# Patient Record
Sex: Female | Born: 2000 | Race: Black or African American | Hispanic: No | Marital: Single | Smoking: Never smoker
Health system: Southern US, Community
[De-identification: ages and names within clinical notes are randomized; demographics above are authoritative.]

## PROBLEM LIST (undated history)

## (undated) HISTORY — PX: TONSILLECTOMY: SUR1361

---
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
## 2021-01-11 ENCOUNTER — Ambulatory Visit (HOSPITAL_COMMUNITY)
Admission: EM | Admit: 2021-01-11 | Discharge: 2021-01-11 | Disposition: A | Discharge status: Home / Self Care | Payer: BC Managed Care – PPO | Attending: Medical Oncology | Admitting: Medical Oncology

## 2021-01-11 ENCOUNTER — Encounter (HOSPITAL_COMMUNITY): Payer: Self-pay

## 2021-01-11 ENCOUNTER — Other Ambulatory Visit: Payer: Self-pay

## 2021-01-11 DIAGNOSIS — N3001 Acute cystitis with hematuria: Secondary | ICD-10-CM

## 2021-01-11 LAB — POC URINE PREG, ED: Preg Test, Ur: NEGATIVE

## 2021-01-11 LAB — POCT URINALYSIS DIPSTICK, ED / UC
Glucose, UA: NEGATIVE mg/dL
Ketones, ur: NEGATIVE mg/dL
Nitrite: NEGATIVE
Protein, ur: >=300 mg/dL — AB
Specific Gravity, Urine: >=1.03 (ref 1.005–1.030)
Urobilinogen, UA: 1 mg/dL (ref 0.0–1.0)
pH: 6.5 (ref 5.0–8.0)

## 2021-01-11 MED ORDER — SULFAMETHOXAZOLE-TRIMETHOPRIM 800-160 MG PO TABS: 1.0000 | ORAL_TABLET | Freq: Two times a day (BID) | ORAL | 0 refills | Status: AC

## 2021-01-11 NOTE — ED Provider Notes (Signed)
[facility]    CSN: [id number] Arrival date & time: 01/11/21  1714      History   Chief Complaint Chief Complaint  Patient presents with  . Urinary Frequency    HPI Heidi Roman is a 20 y.o. female.   HPI   Dysuria: Pt states that she has had urinary frequency, dysuria since yesterday. Associated bladder pressure. She denies abdominal pain, vaginal discharge, fever, vomiting. She has taken one AZO for symptoms yesterday but has not taken any since. This did not help her symptoms.   History reviewed. No pertinent past medical history.  There are no problems to display for this patient.   Past Surgical History:  Procedure Laterality Date  . TONSILLECTOMY      OB History   No obstetric history on file.      Home Medications    Prior to Admission medications   Medication Sig Start Date End Date Taking? Authorizing Provider  acetaminophen (TYLENOL) 325 MG tablet Take 2 tablets (650 mg total) by mouth every 6 (six) hours as needed for mild pain, moderate pain or headache. 11/21/17   Scoville, Nadara Mustard, NP  benzonatate (TESSALON) 100 MG capsule Take 1 capsule (100 mg total) by mouth every 8 (eight) hours as needed for cough. 11/21/17   Sherrilee Gilles, NP  ibuprofen (ADVIL,MOTRIN) 600 MG tablet Take 1 tablet (600 mg total) by mouth every 6 (six) hours as needed for fever, mild pain or moderate pain. 11/21/17   Sherrilee Gilles, NP  meloxicam (MOBIC) 7.5 MG tablet Take 1 tablet (7.5 mg total) by mouth daily. 02/21/14   Carmelina Dane, MD    Family History Family History  Problem Relation Age of Onset  . Heart disease Maternal Grandmother     Social History Social History   Tobacco Use  . Smoking status: Never Smoker     Allergies   Patient has no known allergies.   Review of Systems Review of Systems  As stated above in HPI Physical Exam Triage Vital Signs ED Triage Vitals  Enc Vitals Group     BP 01/11/21 1738 121/67      Pulse Rate 01/11/21 1738 92     Resp 01/11/21 1738 16     Temp 01/11/21 1738 98.9 F (37.2 C)     Temp Source 01/11/21 1738 Oral     SpO2 01/11/21 1738 100 %     Weight --      Height --      Head Circumference --      Peak Flow --      Pain Score 01/11/21 1739 0     Pain Loc --      Pain Edu? --      Excl. in GC? --    No data found.  Updated Vital Signs BP 121/67 (BP Location: Right Arm)   Pulse 92   Temp 98.9 F (37.2 C) (Oral)   Resp 16   LMP 01/07/2021   SpO2 100%   Physical Exam Vitals and nursing note reviewed.  Constitutional:      General: She is not in acute distress.    Appearance: Normal appearance. She is not ill-appearing, toxic-appearing or diaphoretic.  HENT:     Head: Normocephalic and atraumatic.  Cardiovascular:     Rate and Rhythm: Normal rate and regular rhythm.     Heart sounds: Normal heart sounds.  Pulmonary:     Effort: Pulmonary effort is normal.  Breath sounds: Normal breath sounds.  Abdominal:     General: Bowel sounds are normal. There is no distension.     Palpations: Abdomen is soft. There is no mass.     Tenderness: There is no abdominal tenderness. There is no right CVA tenderness, left CVA tenderness, guarding or rebound.     Hernia: No hernia is present.  Lymphadenopathy:     Cervical: No cervical adenopathy.  Neurological:     Mental Status: She is alert.      UC Treatments / Results  Labs (all labs ordered are listed, but only abnormal results are displayed) Labs Reviewed  URINE CULTURE  POC URINE PREG, ED  POCT URINALYSIS DIPSTICK, ED / UC    EKG   Radiology No results found.  Procedures Procedures (including critical care time)  Medications Ordered in UC Medications - No data to display  Initial Impression / Assessment and Plan / UC Course  I have reviewed the triage vital signs and the nursing notes.  Pertinent labs & imaging results that were available during my care of the patient were reviewed by  me and considered in my medical decision making (see chart for details).     New.  Treating for acute cystitis with hematuria complication with Bactrim antibiotic to prevent sepsis.  We discussed how to take this medication along with common potential side effects and precautions.  Hydration with water encouraged.  Red flag signs and symptoms discussed.  Culture pending. Final Clinical Impressions(s) / UC Diagnoses   Final diagnoses:  None   Discharge Instructions   None    ED Prescriptions    None     PDMP not reviewed this encounter.   Rushie Chestnut, [place] 01/11/21 1800

## 2021-01-11 NOTE — ED Triage Notes (Signed)
Pt present urinary frequency with a burning sensation afterwards. Symptoms started yesterday. Pt states she feels a lot of pressure.

## 2021-01-13 LAB — URINE CULTURE

## 2021-01-14 LAB — URINE CULTURE: Culture: >=100000 — AB

## 2021-05-28 ENCOUNTER — Observation Stay (HOSPITAL_COMMUNITY)
Admission: EM | Admit: 2021-05-28 | Discharge: 2021-05-30 | Disposition: A | Discharge status: Home / Self Care | Payer: BC Managed Care – PPO | Attending: General Surgery | Admitting: General Surgery

## 2021-05-28 ENCOUNTER — Emergency Department (HOSPITAL_COMMUNITY): Payer: BC Managed Care – PPO

## 2021-05-28 ENCOUNTER — Other Ambulatory Visit: Payer: Self-pay

## 2021-05-28 ENCOUNTER — Encounter (HOSPITAL_COMMUNITY): Payer: Self-pay | Admitting: *Deleted

## 2021-05-28 DIAGNOSIS — K8012 Calculus of gallbladder with acute and chronic cholecystitis without obstruction: Secondary | ICD-10-CM | POA: Diagnosis not present

## 2021-05-28 DIAGNOSIS — Z20822 Contact with and (suspected) exposure to covid-19: Secondary | ICD-10-CM | POA: Diagnosis not present

## 2021-05-28 DIAGNOSIS — R1011 Right upper quadrant pain: Secondary | ICD-10-CM | POA: Diagnosis present

## 2021-05-28 DIAGNOSIS — K810 Acute cholecystitis: Secondary | ICD-10-CM | POA: Diagnosis present

## 2021-05-28 DIAGNOSIS — K819 Cholecystitis, unspecified: Secondary | ICD-10-CM

## 2021-05-28 LAB — COMPREHENSIVE METABOLIC PANEL
ALT: 17 U/L (ref 0–44)
AST: 19 U/L (ref 15–41)
Albumin: 4.3 g/dL (ref 3.5–5.0)
Alkaline Phosphatase: 61 U/L (ref 38–126)
Anion gap: 9 (ref 5–15)
BUN: 7 mg/dL (ref 6–20)
CO2: 26 mmol/L (ref 22–32)
Calcium: 9.3 mg/dL (ref 8.9–10.3)
Chloride: 99 mmol/L (ref 98–111)
Creatinine, Ser: 0.81 mg/dL (ref 0.44–1.00)
GFR, Estimated: >60 mL/min (ref >60)
Glucose, Bld: 87 mg/dL (ref 70–99)
Potassium: 4.3 mmol/L (ref 3.5–5.1)
Sodium: 134 mmol/L — ABNORMAL LOW (ref 135–145)
Total Bilirubin: 0.5 mg/dL (ref 0.3–1.2)
Total Protein: 8.3 g/dL — ABNORMAL HIGH (ref 6.5–8.1)

## 2021-05-28 LAB — URINALYSIS, ROUTINE W REFLEX MICROSCOPIC
Bacteria, UA: NONE SEEN
Bilirubin Urine: NEGATIVE
Glucose, UA: NEGATIVE mg/dL
Hgb urine dipstick: NEGATIVE
Ketones, ur: NEGATIVE mg/dL
Leukocytes,Ua: NEGATIVE
Nitrite: NEGATIVE
Protein, ur: 30 mg/dL — AB
Specific Gravity, Urine: 1.02 (ref 1.005–1.030)
pH: 8 (ref 5.0–8.0)

## 2021-05-28 LAB — RESP PANEL BY RT-PCR (FLU A&B, COVID) ARPGX2
Influenza A by PCR: NEGATIVE
Influenza B by PCR: NEGATIVE
SARS Coronavirus 2 by RT PCR: NEGATIVE

## 2021-05-28 LAB — CBC
HCT: 37.7 % (ref 36.0–46.0)
Hemoglobin: 12.5 g/dL (ref 12.0–15.0)
MCH: 29.1 pg (ref 26.0–34.0)
MCHC: 33.2 g/dL (ref 30.0–36.0)
MCV: 87.7 fL (ref 80.0–100.0)
Platelets: 335 10*3/uL (ref 150–400)
RBC: 4.3 MIL/uL (ref 3.87–5.11)
RDW: 13.2 % (ref 11.5–15.5)
WBC: 4.3 10*3/uL (ref 4.0–10.5)
nRBC: 0 % (ref 0.0–0.2)

## 2021-05-28 LAB — I-STAT BETA HCG BLOOD, ED (MC, WL, AP ONLY): I-stat hCG, quantitative: <5 m[IU]/mL (ref <5)

## 2021-05-28 LAB — LIPASE, BLOOD: Lipase: 29 U/L (ref 11–51)

## 2021-05-28 MED ORDER — SIMETHICONE 80 MG PO CHEW
40.0000 mg | CHEWABLE_TABLET | Freq: Four times a day (QID) | ORAL | Status: DC | PRN
  Filled 2021-05-28: qty 1

## 2021-05-28 MED ORDER — MORPHINE SULFATE (PF) 2 MG/ML IV SOLN: 1.0000 mg | INTRAVENOUS | Status: DC | PRN

## 2021-05-28 MED ORDER — METOPROLOL TARTRATE 5 MG/5ML IV SOLN: 5.0000 mg | Freq: Four times a day (QID) | INTRAVENOUS | Status: DC | PRN

## 2021-05-28 MED ORDER — ONDANSETRON 4 MG PO TBDP: 4.0000 mg | ORAL_TABLET | Freq: Four times a day (QID) | ORAL | Status: DC | PRN

## 2021-05-28 MED ORDER — DIPHENHYDRAMINE HCL 50 MG/ML IJ SOLN: 25.0000 mg | Freq: Four times a day (QID) | INTRAMUSCULAR | Status: DC | PRN

## 2021-05-28 MED ORDER — KCL IN DEXTROSE-NACL 20-5-0.45 MEQ/L-%-% IV SOLN
INTRAVENOUS | Status: DC
  Filled 2021-05-28 (×3): qty 1000

## 2021-05-28 MED ORDER — ONDANSETRON HCL 4 MG/2ML IJ SOLN: 4.0000 mg | Freq: Four times a day (QID) | INTRAMUSCULAR | Status: DC | PRN

## 2021-05-28 MED ORDER — SODIUM CHLORIDE 0.9 % IV SOLN
2.0000 g | INTRAVENOUS | Status: DC
  Administered 2021-05-28 – 2021-05-29 (×2): 2 g via INTRAVENOUS
  Filled 2021-05-28: qty 2
  Filled 2021-05-28: qty 20

## 2021-05-28 MED ORDER — LIDOCAINE VISCOUS HCL 2 % MT SOLN
15.0000 mL | Freq: Once | OROMUCOSAL | Status: AC
  Administered 2021-05-28: 15 mL via ORAL
  Filled 2021-05-28: qty 15

## 2021-05-28 MED ORDER — ENOXAPARIN SODIUM 40 MG/0.4ML IJ SOSY: 40.0000 mg | PREFILLED_SYRINGE | INTRAMUSCULAR | Status: DC

## 2021-05-28 MED ORDER — TRAMADOL HCL 50 MG PO TABS
50.0000 mg | ORAL_TABLET | Freq: Four times a day (QID) | ORAL | Status: DC | PRN
Start: 2021-05-28 — End: 2021-05-30
  Administered 2021-05-30: 50 mg via ORAL
  Filled 2021-05-28: qty 1

## 2021-05-28 MED ORDER — ALUM & MAG HYDROXIDE-SIMETH 200-200-20 MG/5ML PO SUSP
30.0000 mL | Freq: Once | ORAL | Status: AC
  Administered 2021-05-28: 30 mL via ORAL
  Filled 2021-05-28: qty 30

## 2021-05-28 MED ORDER — ACETAMINOPHEN 500 MG PO TABS
1000.0000 mg | ORAL_TABLET | Freq: Four times a day (QID) | ORAL | Status: DC
  Administered 2021-05-28 – 2021-05-30 (×7): 1000 mg via ORAL
  Filled 2021-05-28 (×8): qty 2

## 2021-05-28 MED ORDER — DIPHENHYDRAMINE HCL 25 MG PO CAPS: 25.0000 mg | ORAL_CAPSULE | Freq: Four times a day (QID) | ORAL | Status: DC | PRN

## 2021-05-28 MED ORDER — MELATONIN 3 MG PO TABS: 3.0000 mg | ORAL_TABLET | Freq: Every evening | ORAL | Status: DC | PRN

## 2021-05-28 NOTE — ED Triage Notes (Signed)
Pt saw her PCP Saturday for Acid reflux that started on Friday. Given meds for reflux, no improvement pain now epigastric and upper abd.

## 2021-05-28 NOTE — ED Provider Notes (Signed)
[facility]EMERGENCY [facility]PT Provider Note   CSN: [id number] Arrival date & time: 05/28/21  0102     History Chief Complaint  Patient presents with   Abdominal Pain    Heidi Roman is a 20 y.o. female.  Patient is a 20 year old female who presents with epigastric and right upper quadrant pain.  She said she started having some reflux symptoms about a week ago.  She does not have a prior known history of reflux.  She had some burning pain in her epigastrium and up into her chest.  She was started on antiacid medications which she says has not been helping.  She now has some discomfort in her right upper abdomen.  She has had some nausea but no vomiting.  No fevers.  No urinary symptoms.  No other chest pain.  No shortness of breath.  No cough or cold symptoms.  No change in stools.      History reviewed. No pertinent past medical history.  Patient Active Problem List   Diagnosis Date Noted   Acute cholecystitis 05/28/2021    Past Surgical History:  Procedure Laterality Date   TONSILLECTOMY       OB History   No obstetric history on file.     Family History  Problem Relation Age of Onset   Heart disease Maternal Grandmother     Social History   Tobacco Use   Smoking status: Never   Smokeless tobacco: Never  Vaping Use   Vaping Use: Never used  Substance Use Topics   Alcohol use: Never   Drug use: Never    Home Medications Prior to Admission medications   Medication Sig Start Date End Date Taking? Authorizing Provider  norgestimate-ethinyl estradiol (ORTHO-CYCLEN) 0.25-35 MG-MCG tablet Take 1 tablet by mouth daily.   Yes [provider]  acetaminophen (TYLENOL) 325 MG tablet Take 2 tablets (650 mg total) by mouth every 6 (six) hours as needed for mild pain, moderate pain or headache. Patient not taking: Reported on 05/28/2021 11/21/17   Sherrilee Gilles, NP  benzonatate (TESSALON) 100 MG capsule Take 1 capsule (100 mg total) by  mouth every 8 (eight) hours as needed for cough. Patient not taking: Reported on 05/28/2021 11/21/17   Sherrilee Gilles, NP  ibuprofen (ADVIL,MOTRIN) 600 MG tablet Take 1 tablet (600 mg total) by mouth every 6 (six) hours as needed for fever, mild pain or moderate pain. Patient not taking: No sig reported 11/21/17   Sherrilee Gilles, NP  meloxicam (MOBIC) 7.5 MG tablet Take 1 tablet (7.5 mg total) by mouth daily. Patient not taking: No sig reported 02/21/14   Carmelina Dane, MD    Allergies    Patient has no known allergies.  Review of Systems   Review of Systems  Constitutional:  Negative for chills, diaphoresis, fatigue and fever.  HENT:  Negative for congestion, rhinorrhea and sneezing.   Eyes: Negative.   Respiratory:  Negative for cough, chest tightness and shortness of breath.   Cardiovascular:  Negative for chest pain and leg swelling.  Gastrointestinal:  Positive for abdominal pain and nausea. Negative for blood in stool, diarrhea and vomiting.  Genitourinary:  Negative for difficulty urinating, flank pain, frequency and hematuria.  Musculoskeletal:  Negative for arthralgias and back pain.  Skin:  Negative for rash.  Neurological:  Negative for dizziness, speech difficulty, weakness, numbness and headaches.   Physical Exam Updated Vital Signs BP 121/86   Pulse 79   Temp 98.3 F (36.8  C) (Oral)   Resp 16   Ht 5' 4.5" (1.638 m)   Wt 77.1 kg   SpO2 100%   BMI 28.73 kg/m   Physical Exam Constitutional:      Appearance: She is well-developed.  HENT:     Head: Normocephalic and atraumatic.  Eyes:     Pupils: Pupils are equal, round, and reactive to light.  Cardiovascular:     Rate and Rhythm: Normal rate and regular rhythm.     Heart sounds: Normal heart sounds.  Pulmonary:     Effort: Pulmonary effort is normal. No respiratory distress.     Breath sounds: Normal breath sounds. No wheezing or rales.  Chest:     Chest wall: No tenderness.  Abdominal:      General: Bowel sounds are normal.     Palpations: Abdomen is soft.     Tenderness: There is abdominal tenderness in the right upper quadrant and epigastric area. There is no guarding or rebound.  Musculoskeletal:        General: Normal range of motion.     Cervical back: Normal range of motion and neck supple.  Lymphadenopathy:     Cervical: No cervical adenopathy.  Skin:    General: Skin is warm and dry.     Findings: No rash.  Neurological:     Mental Status: She is alert and oriented to person, place, and time.    ED Results / Procedures / Treatments   Labs (all labs ordered are listed, but only abnormal results are displayed) Labs Reviewed  COMPREHENSIVE METABOLIC PANEL - Abnormal; Notable for the following components:      Result Value   Sodium 134 (*)    Total Protein 8.3 (*)    All other components within normal limits  URINALYSIS, ROUTINE W REFLEX MICROSCOPIC - Abnormal; Notable for the following components:   APPearance HAZY (*)    Protein, ur 30 (*)    All other components within normal limits  RESP PANEL BY RT-PCR (FLU A&B, COVID) ARPGX2  LIPASE, BLOOD  CBC  HIV ANTIBODY (ROUTINE TESTING W REFLEX)  I-STAT BETA HCG BLOOD, ED ([facility], [facility], [facility] ONLY)    EKG None  Radiology US Abdomen Limited RUQ (LIVER/GB)  Result Date: 05/28/2021 CLINICAL DATA:  Right upper quadrant pain EXAM: ULTRASOUND ABDOMEN LIMITED RIGHT UPPER QUADRANT COMPARISON:  None. FINDINGS: Gallbladder: Multiple echogenic, shadowing stones within the gallbladder lumen measuring up to 1.3 cm in diameter. Layering sludge is also present within the gallbladder. Mild diffuse gallbladder wall thickening. No pericholecystic fluid. No sonographic Murphy sign noted by sonographer. Common bile duct: Diameter: 6 mm. Liver: No focal lesion identified. Within normal limits in parenchymal echogenicity. Portal vein is patent on color Doppler imaging with normal direction of blood flow towards the liver. Other: None.  IMPRESSION: Cholelithiasis with mild diffuse gallbladder wall thickening. Although no sonographic Eulah Pont sign was evident, findings remain suspicious for cholecystitis. If further imaging evaluation is clinically warranted, a nuclear medicine hepatobiliary scan could be performed to assess the patency of the cystic duct. Electronically Signed   By: Duanne Guess D.O.   On: 05/28/2021 10:51    Procedures Procedures   Medications Ordered in ED Medications  enoxaparin (LOVENOX) injection 40 mg (has no administration in time range)  dextrose 5 % and 0.45 % NaCl with KCl 20 mEq/L infusion ( Intravenous New Bag/Given 05/28/21 1141)  cefTRIAXone (ROCEPHIN) 2 g in sodium chloride 0.9 % 100 mL IVPB (2 g Intravenous New Bag/Given 05/28/21  1140)  acetaminophen (TYLENOL) tablet 1,000 mg (1,000 mg Oral Given 05/28/21 1139)  traMADol (ULTRAM) tablet 50 mg (has no administration in time range)  morphine 2 MG/ML injection 1-2 mg (has no administration in time range)  melatonin tablet 3 mg (has no administration in time range)  diphenhydrAMINE (BENADRYL) capsule 25 mg (has no administration in time range)    Or  diphenhydrAMINE (BENADRYL) injection 25 mg (has no administration in time range)  ondansetron (ZOFRAN-ODT) disintegrating tablet 4 mg (has no administration in time range)    Or  ondansetron (ZOFRAN) injection 4 mg (has no administration in time range)  simethicone (MYLICON) chewable tablet 40 mg (has no administration in time range)  metoprolol tartrate (LOPRESSOR) injection 5 mg (has no administration in time range)  alum & mag hydroxide-simeth (MAALOX/MYLANTA) 200-200-20 MG/5ML suspension 30 mL (30 mLs Oral Given 05/28/21 1048)    And  lidocaine (XYLOCAINE) 2 % viscous mouth solution 15 mL (15 mLs Oral Given 05/28/21 1048)    ED Course  I have reviewed the triage vital signs and the nursing notes.  Pertinent labs & imaging results that were available during my care of the patient were reviewed by me  and considered in my medical decision making (see chart for details).    MDM Rules/Calculators/A&P                           Patient is a 20 year old female who presents with epigastric and right upper quadrant pain.  Her labs are nonconcerning.  She is afebrile.  Her white count is normal.  Her LFTs are normal.  There is no suggestions of pancreatitis.  Ultrasound does show gallstones with evidence of cholecystitis.  General surgery was consulted who will admit the patient for further treatment.  She was started on IV Rocephin. Final Clinical Impression(s) / ED Diagnoses Final diagnoses:  RUQ pain  Cholecystitis    Rx / DC Orders ED Discharge Orders     None        Rolan Bucco, MD 05/28/21 1144

## 2021-05-28 NOTE — H&P (Signed)
Admission Note  Heidi Roman 2001-07-19  [id number].    Chief Complaint/Reason for Consult: Cholecystitis  HPI:  20 year old female without significant past medical history who presented to the [facility] ED due to epigastric and right upper quadrant pain. She state symptoms began one week ago as reflux like discomfort in setting of known history of reflux. She describes her symptoms as burning pain in the epigastrium that has now progressed to pain in right upper abdomen. She tried antiacid medications at home without relief. She has had nausea but no emesis. She denies fever, chills, urinary symptoms, chest pain, shortness of breath, diarrhea, constipation.  Work up in ED significant for normal LFTs and WBC and US showing: Cholelithiasis with mild diffuse gallbladder wall thickening. Although no sonographic Eulah Pont sign was evident, findings remain suspicious for cholecystitis.  General surgery was asked to see.   ROS:  see hpi otherwise all other systems have been reviewed and are negative Review of Systems  Constitutional:  Negative for chills and fever.  Respiratory:  Negative for cough and shortness of breath.   Cardiovascular:  Negative for chest pain, palpitations and leg swelling.  Gastrointestinal:  Positive for abdominal pain, heartburn and nausea. Negative for constipation, diarrhea and vomiting.  Genitourinary:  Negative for dysuria, frequency and urgency.   Family History  Problem Relation Age of Onset   Heart disease Maternal Grandmother     History reviewed. No pertinent past medical history.  Past Surgical History:  Procedure Laterality Date   TONSILLECTOMY      Social History:  reports that she has never smoked. She has never used smokeless tobacco. She reports that she does not drink alcohol and does not use drugs.  Allergies: No Known Allergies  (Not in a hospital admission)   Blood pressure 121/86, pulse 79, temperature 98.3 F (36.8 C), temperature  source Oral, resp. rate 16, height 5' 4.5" (1.638 m), weight 77.1 kg, SpO2 100 %. Physical Exam:  General: pleasant, WD, WN, black female who is laying in bed in NAD HEENT: head is normocephalic, atraumatic.  Sclera are noninjected.  PERRL.  Ears and nose without any masses or lesions.  Mouth is pink and moist Heart: regular, rate, and rhythm.  Normal s1,s2. No obvious murmurs, gallops, or rubs noted.  Palpable radial and pedal pulses bilaterally Lungs: CTAB, no wheezes, rhonchi, or rales noted.  Respiratory effort nonlabored Abd: soft, ND, +BS. Mild TTP of epigastric region and RUQ. No rebound or guarding.  No masses, hernias, or organomegaly MS: all 4 extremities are symmetrical with no cyanosis, clubbing, or edema. Skin: warm and dry with no masses, lesions, or rashes Neuro: Cranial nerves 2-12 grossly intact, sensation is normal throughout Psych: A&Ox3 with an appropriate affect.   Results for orders placed or performed during the hospital encounter of 05/28/21 (from the past 48 hour(s))  Lipase, blood     Status: None   Collection Time: 05/28/21  9:03 AM  Result Value Ref Range   Lipase 29 11 - 51 U/L    Comment: Performed at [facility], [address] [place], [place] [address]  Comprehensive metabolic panel     Status: Abnormal   Collection Time: 05/28/21  9:03 AM  Result Value Ref Range   Sodium 134 (L) 135 - 145 mmol/L   Potassium 4.3 3.5 - 5.1 mmol/L   Chloride 99 98 - 111 mmol/L   CO2 26 22 - 32 mmol/L   Glucose, Bld 87  70 - 99 mg/dL    Comment: Glucose reference range applies only to samples taken after fasting for at least 8 hours.   BUN 7 6 - 20 mg/dL   Creatinine, Ser 5.46 0.44 - 1.00 mg/dL   Calcium 9.3 8.9 - 56.8 mg/dL   Total Protein 8.3 (H) 6.5 - 8.1 g/dL   Albumin 4.3 3.5 - 5.0 g/dL   AST 19 15 - 41 U/L   ALT 17 0 - 44 U/L   Alkaline Phosphatase 61 38 - 126 U/L   Total Bilirubin 0.5 0.3 - 1.2 mg/dL   GFR, Estimated >12 >75 mL/min     Comment: (NOTE) Calculated using the CKD-EPI Creatinine Equation (2021)    Anion gap 9 5 - 15    Comment: Performed at [facility], [address], [place], [place] [address]  CBC     Status: None   Collection Time: 05/28/21  9:03 AM  Result Value Ref Range   WBC 4.3 4.0 - 10.5 K/uL   RBC 4.30 3.87 - 5.11 MIL/uL   Hemoglobin 12.5 12.0 - 15.0 g/dL   HCT 74.9 44.9 - 67.5 %   MCV 87.7 80.0 - 100.0 fL   MCH 29.1 26.0 - 34.0 pg   MCHC 33.2 30.0 - 36.0 g/dL   RDW 91.6 38.4 - 66.5 %   Platelets 335 150 - 400 K/uL   nRBC 0.0 0.0 - 0.2 %    Comment: Performed at [facility], [address] [place], [place] [address]  Urinalysis, Routine w reflex microscopic Urine, Clean Catch     Status: Abnormal   Collection Time: 05/28/21  9:35 AM  Result Value Ref Range   Color, Urine YELLOW YELLOW   APPearance HAZY (A) CLEAR   Specific Gravity, Urine 1.020 1.005 - 1.030   pH 8.0 5.0 - 8.0   Glucose, UA NEGATIVE NEGATIVE mg/dL   Hgb urine dipstick NEGATIVE NEGATIVE   Bilirubin Urine NEGATIVE NEGATIVE   Ketones, ur NEGATIVE NEGATIVE mg/dL   Protein, ur 30 (A) NEGATIVE mg/dL   Nitrite NEGATIVE NEGATIVE   Leukocytes,Ua NEGATIVE NEGATIVE   RBC / HPF 0-5 0 - 5 RBC/hpf   WBC, UA 0-5 0 - 5 WBC/hpf   Bacteria, UA NONE SEEN NONE SEEN   Squamous Epithelial / LPF 0-5 0 - 5   Mucus PRESENT     Comment: Performed at [facility], [address], [place], [place] [address]  I-Stat beta hCG blood, ED     Status: None   Collection Time: 05/28/21 10:01 AM  Result Value Ref Range   I-stat hCG, quantitative <5.0 <5 mIU/mL   Comment 3            Comment:   GEST. AGE      CONC.  (mIU/mL)   <=1 WEEK        5 - 50     2 WEEKS       50 - 500     3 WEEKS       100 - 10,000     4 WEEKS     1,000 - 30,000        FEMALE AND NON-PREGNANT FEMALE:     LESS THAN 5 mIU/mL    US Abdomen Limited RUQ (LIVER/GB)  Result Date: 05/28/2021 CLINICAL DATA:  Right upper  quadrant pain EXAM: ULTRASOUND ABDOMEN LIMITED RIGHT UPPER QUADRANT COMPARISON:  None. FINDINGS: Gallbladder: Multiple echogenic, shadowing stones within the gallbladder lumen  measuring up to 1.3 cm in diameter. Layering sludge is also present within the gallbladder. Mild diffuse gallbladder wall thickening. No pericholecystic fluid. No sonographic Murphy sign noted by sonographer. Common bile duct: Diameter: 6 mm. Liver: No focal lesion identified. Within normal limits in parenchymal echogenicity. Portal vein is patent on color Doppler imaging with normal direction of blood flow towards the liver. Other: None. IMPRESSION: Cholelithiasis with mild diffuse gallbladder wall thickening. Although no sonographic Eulah Pont sign was evident, findings remain suspicious for cholecystitis. If further imaging evaluation is clinically warranted, a nuclear medicine hepatobiliary scan could be performed to assess the patency of the cystic duct. Electronically Signed   By: Duanne Guess D.O.   On: 05/28/2021 10:51      Assessment/Plan Biliary colic vs early Cholecystitis Symptoms, exam, and imaging consistent with biliary colic and possible early cholecystitis. She is currently afebrile and vital signs stable. We will admit and continue IV abx - rocephin started in the ED. Multimodal pain control and antiemetic medications ordered. Will monitor her CBC and LFTs. Plan for laparoscopic cholecystectomy with Dr. Denyse Amass tomorrow.  FEN: CLD, NPO p MN ID: rocephin VTE: lovenox, SCDs  Letha Cape, [facility] Surgery 05/28/2021, 12:06 PM Please see Amion for pager number during day hours 7:00am-4:30pm

## 2021-05-29 ENCOUNTER — Encounter (HOSPITAL_COMMUNITY)
Admission: EM | Disposition: A | Discharge status: Home / Self Care | Payer: Self-pay | Source: Home / Self Care | Attending: Emergency Medicine

## 2021-05-29 ENCOUNTER — Observation Stay (HOSPITAL_COMMUNITY): Payer: BC Managed Care – PPO | Admitting: Anesthesiology

## 2021-05-29 HISTORY — PX: CHOLECYSTECTOMY: SHX55

## 2021-05-29 LAB — COMPREHENSIVE METABOLIC PANEL
ALT: 17 U/L (ref 0–44)
AST: 16 U/L (ref 15–41)
Albumin: 3.4 g/dL — ABNORMAL LOW (ref 3.5–5.0)
Alkaline Phosphatase: 50 U/L (ref 38–126)
Anion gap: 3 — ABNORMAL LOW (ref 5–15)
BUN: <5 mg/dL — ABNORMAL LOW (ref 6–20)
CO2: 25 mmol/L (ref 22–32)
Calcium: 8.8 mg/dL — ABNORMAL LOW (ref 8.9–10.3)
Chloride: 109 mmol/L (ref 98–111)
Creatinine, Ser: 0.68 mg/dL (ref 0.44–1.00)
GFR, Estimated: >60 mL/min (ref >60)
Glucose, Bld: 95 mg/dL (ref 70–99)
Potassium: 4.4 mmol/L (ref 3.5–5.1)
Sodium: 137 mmol/L (ref 135–145)
Total Bilirubin: 0.5 mg/dL (ref 0.3–1.2)
Total Protein: 6.8 g/dL (ref 6.5–8.1)

## 2021-05-29 LAB — CBC
HCT: 34.9 % — ABNORMAL LOW (ref 36.0–46.0)
Hemoglobin: 11.4 g/dL — ABNORMAL LOW (ref 12.0–15.0)
MCH: 29.1 pg (ref 26.0–34.0)
MCHC: 32.7 g/dL (ref 30.0–36.0)
MCV: 89 fL (ref 80.0–100.0)
Platelets: 292 10*3/uL (ref 150–400)
RBC: 3.92 MIL/uL (ref 3.87–5.11)
RDW: 13.2 % (ref 11.5–15.5)
WBC: 3.5 10*3/uL — ABNORMAL LOW (ref 4.0–10.5)
nRBC: 0 % (ref 0.0–0.2)

## 2021-05-29 LAB — HIV ANTIBODY (ROUTINE TESTING W REFLEX): HIV Screen 4th Generation wRfx: NONREACTIVE

## 2021-05-29 SURGERY — LAPAROSCOPIC CHOLECYSTECTOMY
Anesthesia: General | Site: Abdomen

## 2021-05-29 MED ORDER — MIDAZOLAM HCL 2 MG/2ML IJ SOLN
INTRAMUSCULAR | Status: DC | PRN
  Administered 2021-05-29: 2 mg via INTRAVENOUS

## 2021-05-29 MED ORDER — ROCURONIUM BROMIDE 10 MG/ML (PF) SYRINGE
PREFILLED_SYRINGE | INTRAVENOUS | Status: DC | PRN
  Administered 2021-05-29: 10 mg via INTRAVENOUS
  Administered 2021-05-29: 70 mg via INTRAVENOUS

## 2021-05-29 MED ORDER — ENOXAPARIN SODIUM 40 MG/0.4ML IJ SOSY: 40.0000 mg | PREFILLED_SYRINGE | INTRAMUSCULAR | Status: DC

## 2021-05-29 MED ORDER — DEXAMETHASONE SODIUM PHOSPHATE 10 MG/ML IJ SOLN
INTRAMUSCULAR | Status: AC
  Filled 2021-05-29: qty 1

## 2021-05-29 MED ORDER — INDOCYANINE GREEN 25 MG IV SOLR
25.0000 mg | Freq: Once | INTRAVENOUS | Status: DC
  Filled 2021-05-29: qty 10

## 2021-05-29 MED ORDER — FENTANYL CITRATE (PF) 250 MCG/5ML IJ SOLN
INTRAMUSCULAR | Status: AC
  Filled 2021-05-29: qty 5

## 2021-05-29 MED ORDER — ONDANSETRON HCL 4 MG/2ML IJ SOLN
INTRAMUSCULAR | Status: DC | PRN
Start: 2021-05-29 — End: 2021-05-29
  Administered 2021-05-29: 4 mg via INTRAVENOUS

## 2021-05-29 MED ORDER — LIDOCAINE 2% (20 MG/ML) 5 ML SYRINGE
INTRAMUSCULAR | Status: DC | PRN
  Administered 2021-05-29: 60 mg via INTRAVENOUS

## 2021-05-29 MED ORDER — ROCURONIUM BROMIDE 10 MG/ML (PF) SYRINGE
PREFILLED_SYRINGE | INTRAVENOUS | Status: AC
  Filled 2021-05-29: qty 10

## 2021-05-29 MED ORDER — SCOPOLAMINE 1 MG/3DAYS TD PT72: 1.0000 | MEDICATED_PATCH | TRANSDERMAL | Status: DC

## 2021-05-29 MED ORDER — BUPIVACAINE-EPINEPHRINE (PF) 0.25% -1:200000 IJ SOLN
INTRAMUSCULAR | Status: AC
  Filled 2021-05-29: qty 30

## 2021-05-29 MED ORDER — HYDROMORPHONE HCL 1 MG/ML IJ SOLN: 0.2500 mg | INTRAMUSCULAR | Status: DC | PRN

## 2021-05-29 MED ORDER — 0.9 % SODIUM CHLORIDE (POUR BTL) OPTIME
TOPICAL | Status: DC | PRN
  Administered 2021-05-29: 1000 mL

## 2021-05-29 MED ORDER — KETOROLAC TROMETHAMINE 30 MG/ML IJ SOLN
INTRAMUSCULAR | Status: AC
  Filled 2021-05-29: qty 1

## 2021-05-29 MED ORDER — BUPIVACAINE-EPINEPHRINE 0.25% -1:200000 IJ SOLN
INTRAMUSCULAR | Status: DC | PRN
  Administered 2021-05-29: 30 mL

## 2021-05-29 MED ORDER — SCOPOLAMINE 1 MG/3DAYS TD PT72
MEDICATED_PATCH | TRANSDERMAL | Status: AC
  Administered 2021-05-29: 1.5 mg via TRANSDERMAL
  Filled 2021-05-29: qty 1

## 2021-05-29 MED ORDER — KCL IN DEXTROSE-NACL 20-5-0.45 MEQ/L-%-% IV SOLN: INTRAVENOUS | Status: DC

## 2021-05-29 MED ORDER — PROMETHAZINE HCL 25 MG/ML IJ SOLN: 6.2500 mg | INTRAMUSCULAR | Status: DC | PRN

## 2021-05-29 MED ORDER — OXYCODONE HCL 5 MG PO TABS: 5.0000 mg | ORAL_TABLET | Freq: Once | ORAL | Status: DC | PRN

## 2021-05-29 MED ORDER — ONDANSETRON HCL 4 MG/2ML IJ SOLN
INTRAMUSCULAR | Status: AC
  Filled 2021-05-29: qty 2

## 2021-05-29 MED ORDER — KETOROLAC TROMETHAMINE 30 MG/ML IJ SOLN
30.0000 mg | Freq: Three times a day (TID) | INTRAMUSCULAR | Status: DC
  Administered 2021-05-29 – 2021-05-30 (×2): 30 mg via INTRAVENOUS
  Filled 2021-05-29 (×2): qty 1

## 2021-05-29 MED ORDER — SPY AGENT GREEN - (INDOCYANINE FOR INJECTION)
INTRAMUSCULAR | Status: DC | PRN
  Administered 2021-05-29: 2 mL via INTRAVENOUS

## 2021-05-29 MED ORDER — PROPOFOL 10 MG/ML IV BOLUS
INTRAVENOUS | Status: AC
  Filled 2021-05-29: qty 20

## 2021-05-29 MED ORDER — OXYCODONE HCL 5 MG/5ML PO SOLN: 5.0000 mg | Freq: Once | ORAL | Status: DC | PRN

## 2021-05-29 MED ORDER — MIDAZOLAM HCL 2 MG/2ML IJ SOLN
INTRAMUSCULAR | Status: AC
  Filled 2021-05-29: qty 2

## 2021-05-29 MED ORDER — SUGAMMADEX SODIUM 200 MG/2ML IV SOLN
INTRAVENOUS | Status: DC | PRN
  Administered 2021-05-29: 200 mg via INTRAVENOUS

## 2021-05-29 MED ORDER — DEXAMETHASONE SODIUM PHOSPHATE 10 MG/ML IJ SOLN
INTRAMUSCULAR | Status: DC | PRN
  Administered 2021-05-29: 5 mg via INTRAVENOUS

## 2021-05-29 MED ORDER — INDOCYANINE GREEN 25 MG IV SOLR: INTRAVENOUS | Status: DC | PRN

## 2021-05-29 MED ORDER — LACTATED RINGERS IV SOLN: INTRAVENOUS | Status: DC

## 2021-05-29 MED ORDER — PHENYLEPHRINE 40 MCG/ML (10ML) SYRINGE FOR IV PUSH (FOR BLOOD PRESSURE SUPPORT)
PREFILLED_SYRINGE | INTRAVENOUS | Status: AC
  Filled 2021-05-29: qty 10

## 2021-05-29 MED ORDER — KETOROLAC TROMETHAMINE 30 MG/ML IJ SOLN: 30.0000 mg | Freq: Once | INTRAMUSCULAR | Status: DC | PRN

## 2021-05-29 MED ORDER — PHENYLEPHRINE 40 MCG/ML (10ML) SYRINGE FOR IV PUSH (FOR BLOOD PRESSURE SUPPORT)
PREFILLED_SYRINGE | INTRAVENOUS | Status: DC | PRN
  Administered 2021-05-29: 120 ug via INTRAVENOUS
  Administered 2021-05-29: 80 ug via INTRAVENOUS

## 2021-05-29 MED ORDER — DEXMEDETOMIDINE (PRECEDEX) IN NS 20 MCG/5ML (4 MCG/ML) IV SYRINGE
PREFILLED_SYRINGE | INTRAVENOUS | Status: DC | PRN
  Administered 2021-05-29: 12 ug via INTRAVENOUS

## 2021-05-29 MED ORDER — LACTATED RINGERS IR SOLN
Status: DC | PRN
  Administered 2021-05-29: 1000 mL

## 2021-05-29 MED ORDER — MEPERIDINE HCL 50 MG/ML IJ SOLN: 6.2500 mg | INTRAMUSCULAR | Status: DC | PRN

## 2021-05-29 MED ORDER — ACETAMINOPHEN 500 MG PO TABS: 1000.0000 mg | ORAL_TABLET | Freq: Once | ORAL | Status: DC

## 2021-05-29 MED ORDER — LIDOCAINE 2% (20 MG/ML) 5 ML SYRINGE
INTRAMUSCULAR | Status: AC
  Filled 2021-05-29: qty 5

## 2021-05-29 MED ORDER — KETOROLAC TROMETHAMINE 30 MG/ML IJ SOLN
INTRAMUSCULAR | Status: DC | PRN
  Administered 2021-05-29: 30 mg via INTRAVENOUS

## 2021-05-29 MED ORDER — DEXMEDETOMIDINE (PRECEDEX) IN NS 20 MCG/5ML (4 MCG/ML) IV SYRINGE
PREFILLED_SYRINGE | INTRAVENOUS | Status: AC
  Filled 2021-05-29: qty 5

## 2021-05-29 MED ORDER — FENTANYL CITRATE (PF) 250 MCG/5ML IJ SOLN
INTRAMUSCULAR | Status: DC | PRN
  Administered 2021-05-29: 100 ug via INTRAVENOUS
  Administered 2021-05-29 (×3): 50 ug via INTRAVENOUS

## 2021-05-29 MED ORDER — PROPOFOL 10 MG/ML IV BOLUS
INTRAVENOUS | Status: DC | PRN
  Administered 2021-05-29: 150 mg via INTRAVENOUS

## 2021-05-29 MED ORDER — APREPITANT 40 MG PO CAPS: 40.0000 mg | ORAL_CAPSULE | Freq: Once | ORAL | Status: AC

## 2021-05-29 MED ORDER — APREPITANT 40 MG PO CAPS
ORAL_CAPSULE | ORAL | Status: AC
  Administered 2021-05-29: 40 mg via ORAL
  Filled 2021-05-29: qty 1

## 2021-05-29 SURGICAL SUPPLY — 59 items
ADH SKN CLS APL DERMABOND .7 (GAUZE/BANDAGES/DRESSINGS)
APL PRP STRL LF DISP 70% ISPRP (MISCELLANEOUS) ×1
APL SKNCLS STERI-STRIP NONHPOA (GAUZE/BANDAGES/DRESSINGS)
APL SRG 38 LTWT LNG FL B (MISCELLANEOUS)
APPLICATOR ARISTA FLEXITIP XL (MISCELLANEOUS) IMPLANT
APPLIER CLIP 5 13 M/L LIGAMAX5 (MISCELLANEOUS) ×2
APPLIER CLIP ROT 10 11.4 M/L (STAPLE)
APR CLP MED LRG 11.4X10 (STAPLE)
APR CLP MED LRG 5 ANG JAW (MISCELLANEOUS) ×1
BAG COUNTER SPONGE SURGICOUNT (BAG) IMPLANT
BAG SPEC RTRVL 10 TROC 200 (ENDOMECHANICALS) ×1
BAG SPNG CNTER NS LX DISP (BAG)
BENZOIN TINCTURE PRP APPL 2/3 (GAUZE/BANDAGES/DRESSINGS) IMPLANT
BNDG ADH 1X3 SHEER STRL LF (GAUZE/BANDAGES/DRESSINGS) ×8 IMPLANT
BNDG ADH THN 3X1 STRL LF (GAUZE/BANDAGES/DRESSINGS) ×4
CABLE HIGH FREQUENCY MONO STRZ (ELECTRODE) ×2 IMPLANT
CHLORAPREP W/TINT 26 (MISCELLANEOUS) ×2 IMPLANT
CLIP APPLIE 5 13 M/L LIGAMAX5 (MISCELLANEOUS) IMPLANT
CLIP APPLIE ROT 10 11.4 M/L (STAPLE) IMPLANT
CLIP VESOLOCK MED LG 6/CT (CLIP) IMPLANT
COVER MAYO STAND STRL (DRAPES) IMPLANT
COVER SURGICAL LIGHT HANDLE (MISCELLANEOUS) ×2 IMPLANT
DECANTER SPIKE VIAL GLASS SM (MISCELLANEOUS) ×2 IMPLANT
DERMABOND ADVANCED (GAUZE/BANDAGES/DRESSINGS)
DERMABOND ADVANCED .7 DNX12 (GAUZE/BANDAGES/DRESSINGS) IMPLANT
DRAPE C-ARM 42X120 X-RAY (DRAPES) IMPLANT
DRSG TEGADERM 2-3/8X2-3/4 SM (GAUZE/BANDAGES/DRESSINGS) IMPLANT
ELECT REM PT RETURN 15FT ADLT (MISCELLANEOUS) ×2 IMPLANT
GAUZE SPONGE 2X2 8PLY STRL LF (GAUZE/BANDAGES/DRESSINGS) IMPLANT
GLOVE SRG 8 PF TXTR STRL LF DI (GLOVE) ×1 IMPLANT
GLOVE SURG MICRO LTX SZ7.5 (GLOVE) ×2 IMPLANT
GLOVE SURG UNDER POLY LF SZ8 (GLOVE) ×2
GOWN STRL REUS W/TWL XL LVL3 (GOWN DISPOSABLE) ×6 IMPLANT
GRASPER SUT TROCAR 14GX15 (MISCELLANEOUS) IMPLANT
HEMOSTAT ARISTA ABSORB 3G PWDR (HEMOSTASIS) IMPLANT
HEMOSTAT SNOW SURGICEL 2X4 (HEMOSTASIS) IMPLANT
KIT BASIN OR (CUSTOM PROCEDURE TRAY) ×2 IMPLANT
KIT TURNOVER KIT A (KITS) ×2 IMPLANT
L-HOOK LAP DISP 36CM (ELECTROSURGICAL)
LHOOK LAP DISP 36CM (ELECTROSURGICAL) IMPLANT
POUCH RETRIEVAL ECOSAC 10 (ENDOMECHANICALS) ×1 IMPLANT
POUCH RETRIEVAL ECOSAC 10MM (ENDOMECHANICALS) ×2
SCISSORS LAP 5X35 DISP (ENDOMECHANICALS) ×2 IMPLANT
SET CHOLANGIOGRAPH MIX (MISCELLANEOUS) IMPLANT
SET IRRIG TUBING LAPAROSCOPIC (IRRIGATION / IRRIGATOR) ×2 IMPLANT
SET TUBE SMOKE EVAC HIGH FLOW (TUBING) ×2 IMPLANT
SLEEVE XCEL OPT CAN 5 100 (ENDOMECHANICALS) ×4 IMPLANT
SPONGE GAUZE 2X2 STER 10/PKG (GAUZE/BANDAGES/DRESSINGS)
STRIP CLOSURE SKIN 1/2X4 (GAUZE/BANDAGES/DRESSINGS) IMPLANT
SUT MNCRL AB 4-0 PS2 18 (SUTURE) ×2 IMPLANT
SUT VIC AB 0 UR5 27 (SUTURE) ×1 IMPLANT
SUT VICRYL 0 TIES 12 18 (SUTURE) IMPLANT
SUT VICRYL 0 UR6 27IN ABS (SUTURE) IMPLANT
TOWEL OR 17X26 10 PK STRL BLUE (TOWEL DISPOSABLE) ×2 IMPLANT
TOWEL OR NON WOVEN STRL DISP B (DISPOSABLE) ×2 IMPLANT
TRAY LAPAROSCOPIC (CUSTOM PROCEDURE TRAY) ×2 IMPLANT
TROCAR BLADELESS OPT 5 100 (ENDOMECHANICALS) ×2 IMPLANT
TROCAR XCEL BLUNT TIP 100MML (ENDOMECHANICALS) IMPLANT
TROCAR XCEL NON-BLD 11X100MML (ENDOMECHANICALS) IMPLANT

## 2021-05-29 NOTE — Transfer of Care (Signed)
Immediate Anesthesia Transfer of Care Note  Patient: Heidi Roman  Procedure(s) Performed: LAPAROSCOPIC CHOLECYSTECTOMY (Abdomen)  Patient Location: PACU  Anesthesia Type:General  Level of Consciousness: awake, alert  and patient cooperative  Airway & Oxygen Therapy: Patient Spontanous Breathing and Patient connected to face mask oxygen  Post-op Assessment: Report given to RN and Post -op Vital signs reviewed and stable  Post vital signs: Reviewed and stable  Last Vitals:  Vitals Value Taken Time  BP 132/77 05/29/21 1615  Temp    Pulse 71 05/29/21 1616  Resp 19 05/29/21 1616  SpO2 100 % 05/29/21 1616  Vitals shown include unvalidated device data.  Last Pain:  Vitals:   05/29/21 1332  TempSrc: Oral  PainSc:          Complications: No notable events documented.

## 2021-05-29 NOTE — Anesthesia Procedure Notes (Signed)
Procedure Name: Intubation Date/Time: 05/29/2021 2:31 PM Performed by: Elyn Peers, CRNA Pre-anesthesia Checklist: Patient identified, Emergency Drugs available, Suction available, Patient being monitored and Timeout performed Patient Re-evaluated:Patient Re-evaluated prior to induction Oxygen Delivery Method: Circle system utilized Preoxygenation: Pre-oxygenation with 100% oxygen Induction Type: IV induction Ventilation: Mask ventilation without difficulty Laryngoscope Size: Miller and 3 Grade View: Grade I Tube type: Oral Tube size: 7.0 mm Number of attempts: 1 Airway Equipment and Method: Stylet Placement Confirmation: ETT inserted through vocal cords under direct vision, positive ETCO2 and breath sounds checked- equal and bilateral Secured at: 21 cm Tube secured with: Tape Dental Injury: Teeth and Oropharynx as per pre-operative assessment

## 2021-05-29 NOTE — Discharge Instructions (Addendum)
[facility]  Please arrive at least 30 min before your appointment to complete your check in paperwork.  If you are unable to arrive 30 min prior to your appointment time we may have to cancel or reschedule you. LAPAROSCOPIC SURGERY: POST OP INSTRUCTIONS Always review your discharge instruction sheet given to you by the facility where your surgery was performed. IF YOU HAVE DISABILITY OR FAMILY LEAVE FORMS, YOU MUST BRING THEM TO THE OFFICE FOR PROCESSING.   DO NOT GIVE THEM TO YOUR DOCTOR.  PAIN CONTROL  First take acetaminophen (Tylenol) AND/or ibuprofen (Advil) to control your pain after surgery.  Follow directions on package.  Taking acetaminophen (Tylenol) and/or ibuprofen (Advil) regularly after surgery will help to control your pain and lower the amount of prescription pain medication you may need.  You should not take more than 4,000 mg (4 grams) of acetaminophen (Tylenol) in 24 hours.  You should not take ibuprofen (Advil), aleve, motrin, naprosyn or other NSAIDS if you have a history of stomach ulcers or chronic kidney disease.  A prescription for pain medication may be given to you upon discharge.  Take your pain medication as prescribed, if you still have uncontrolled pain after taking acetaminophen (Tylenol) or ibuprofen (Advil). Use ice packs to help control pain. If you need a refill on your pain medication, please contact your pharmacy.  They will contact our office to request authorization. Prescriptions will not be filled after 5pm or on week-ends.  HOME MEDICATIONS Take your usually prescribed medications unless otherwise directed.  DIET You should follow a light diet the first few days after arrival home.  Be sure to include lots of fluids daily. Avoid fatty, fried foods.   CONSTIPATION It is common to experience some constipation after surgery and if you are taking pain medication.  Increasing fluid intake and taking a stool softener (such as Colace)  will usually help or prevent this problem from occurring.  A mild laxative (Milk of Magnesia or Miralax) should be taken according to package instructions if there are no bowel movements after 48 hours.  WOUND/INCISION CARE Most patients will experience some swelling and bruising in the area of the incisions.  Ice packs will help.  Swelling and bruising can take several days to resolve.  Unless discharge instructions indicate otherwise, follow guidelines below  STERI-STRIPS - you may remove your outer bandages 48 hours after surgery, and you may shower at that time.  You have steri-strips (small skin tapes) in place directly over the incision.  These strips should be left on the skin for 7-10 days.   DERMABOND/SKIN GLUE - you may shower in 24 hours.  The glue will flake off over the next 2-3 weeks. Any sutures or staples will be removed at the office during your follow-up visit.  ACTIVITIES You may resume regular (light) daily activities beginning the next day--such as daily self-care, walking, climbing stairs--gradually increasing activities as tolerated.  You may have sexual intercourse when it is comfortable.  Refrain from any heavy lifting or straining until approved by your doctor. You may drive when you are no longer taking prescription pain medication, you can comfortably wear a seatbelt, and you can safely maneuver your car and apply brakes.  FOLLOW-UP You should see your doctor in the office for a follow-up appointment approximately 2-3 weeks after your surgery.  You should have been given your post-op/follow-up appointment when your surgery was scheduled.  If you did not receive a post-op/follow-up appointment, make sure   that you call for this appointment within a day or two after you arrive home to insure a convenient appointment time.   WHEN TO CALL YOUR DOCTOR: Fever over 101.0 Inability to urinate Continued bleeding from incision. Increased pain, redness, or drainage from the  incision. Increasing abdominal pain  The clinic staff is available to answer your questions during regular business hours.  Please don't hesitate to call and ask to speak to one of the nurses for clinical concerns.  If you have a medical emergency, go to the nearest emergency room or call 911.  A surgeon from [facility] is always on call at the hospital. [address], [address], [place], [place]  [address] ? [address] [address], [place], [place]   [address] [phone] ? [phone] ? FAX [phone]     Managing Your Pain After Surgery Without Opioids    Thank you for participating in our program to help patients manage their pain after surgery without opioids. This is part of our effort to provide you with the best care possible, without exposing you or your family to the risk that opioids pose.  What pain can I expect after surgery? You can expect to have some pain after surgery. This is normal. The pain is typically worse the day after surgery, and quickly begins to get better. Many studies have found that many patients are able to manage their pain after surgery with Over-the-Counter (OTC) medications such as Tylenol and Motrin. If you have a condition that does not allow you to take Tylenol or Motrin, notify your surgical team.  How will I manage my pain? The best strategy for controlling your pain after surgery is around the clock pain control with Tylenol (acetaminophen) and Motrin (ibuprofen or Advil). Alternating these medications with each other allows you to maximize your pain control. In addition to Tylenol and Motrin, you can use heating pads or ice packs on your incisions to help reduce your pain.  How will I alternate your regular strength over-the-counter pain medication? You will take a dose of pain medication every three hours. Start by taking 650 mg of Tylenol (2 pills of 325 mg) 3 hours later take 600 mg of Motrin (3 pills of 200 mg) 3 hours after  taking the Motrin take 650 mg of Tylenol 3 hours after that take 600 mg of Motrin.   - 1 -  See example - if your first dose of Tylenol is at 12:00 PM   12:00 PM Tylenol 650 mg (2 pills of 325 mg)  3:00 PM Motrin 600 mg (3 pills of 200 mg)  6:00 PM Tylenol 650 mg (2 pills of 325 mg)  9:00 PM Motrin 600 mg (3 pills of 200 mg)  Continue alternating every 3 hours   We recommend that you follow this schedule around-the-clock for at least 3 days after surgery, or until you feel that it is no longer needed. Use the table on the last page of this handout to keep track of the medications you are taking. Important: Do not take more than 3000mg of Tylenol or 1600mg of Motrin in a 24-hour period. Do not take ibuprofen/Motrin if you have a history of bleeding stomach ulcers, severe kidney disease, &/or actively taking a blood thinner  What if I still have pain? If you have pain that is not controlled with the over-the-counter pain medications (Tylenol and Motrin or Advil) you might have what we call "breakthrough" pain. You will receive a prescription   for a small amount of an opioid pain medication such as Oxycodone, Tramadol, or Tylenol with Codeine. Use these opioid pills in the first 24 hours after surgery if you have breakthrough pain. Do not take more than 1 pill every 4-6 hours.  If you still have uncontrolled pain after using all opioid pills, don't hesitate to call our staff using the number provided. We will help make sure you are managing your pain in the best way possible, and if necessary, we can provide a prescription for additional pain medication.   Day 1    Time  Name of Medication Number of pills taken  Amount of Acetaminophen  Pain Level   Comments  AM PM       AM PM       AM PM       AM PM       AM PM       AM PM       AM PM       AM PM       Total Daily amount of Acetaminophen Do not take more than  3,000 mg per day      Day 2    Time  Name of Medication  Number of pills taken  Amount of Acetaminophen  Pain Level   Comments  AM PM       AM PM       AM PM       AM PM       AM PM       AM PM       AM PM       AM PM       Total Daily amount of Acetaminophen Do not take more than  3,000 mg per day      Day 3    Time  Name of Medication Number of pills taken  Amount of Acetaminophen  Pain Level   Comments  AM PM       AM PM       AM PM       AM PM          AM PM       AM PM       AM PM       AM PM       Total Daily amount of Acetaminophen Do not take more than  3,000 mg per day      Day 4    Time  Name of Medication Number of pills taken  Amount of Acetaminophen  Pain Level   Comments  AM PM       AM PM       AM PM       AM PM       AM PM       AM PM       AM PM       AM PM       Total Daily amount of Acetaminophen Do not take more than  3,000 mg per day      Day 5    Time  Name of Medication Number of pills taken  Amount of Acetaminophen  Pain Level   Comments  AM PM       AM PM       AM PM       AM PM       AM PM       AM   PM       AM PM       AM PM       Total Daily amount of Acetaminophen Do not take more than  3,000 mg per day       Day 6    Time  Name of Medication Number of pills taken  Amount of Acetaminophen  Pain Level  Comments  AM PM       AM PM       AM PM       AM PM       AM PM       AM PM       AM PM       AM PM       Total Daily amount of Acetaminophen Do not take more than  3,000 mg per day      Day 7    Time  Name of Medication Number of pills taken  Amount of Acetaminophen  Pain Level   Comments  AM PM       AM PM       AM PM       AM PM       AM PM       AM PM       AM PM       AM PM       Total Daily amount of Acetaminophen Do not take more than  3,000 mg per day        For additional information about how and where to safely dispose of unused opioid medications - https://www.morepowerfulnc.org  Disclaimer: This document  contains information and/or instructional materials adapted from [facility] for the typical patient with your condition. It does not replace medical advice from your health care provider because your experience may differ from that of the typical patient. Talk to your health care provider if you have any questions about this document, your condition or your treatment plan. Adapted from [facility]  

## 2021-05-29 NOTE — Interval H&P Note (Signed)
History and Physical Interval Note:  05/29/2021 1:16 PM  Heidi Roman  has presented today for surgery, with the diagnosis of CHOLECYSTITIS.  The various methods of treatment have been discussed with the patient and family. After consideration of risks, benefits and other options for treatment, the patient has consented to  Procedure(s): LAPAROSCOPIC CHOLECYSTECTOMY (N/A) as a surgical intervention.  The patient's history has been reviewed, patient examined, no change in status, stable for surgery.  I have reviewed the patient's chart and labs.  Questions were answered to the patient's satisfaction.    Mary Sella. Andrey Campanile, MD, FACS General, Bariatric, & Minimally Invasive Surgery [facility]  Gaynelle Adu

## 2021-05-29 NOTE — Anesthesia Postprocedure Evaluation (Signed)
Anesthesia Post Note  Patient: Imelda Pillow  Procedure(s) Performed: LAPAROSCOPIC CHOLECYSTECTOMY (Abdomen)     Patient location during evaluation: PACU Anesthesia Type: General Level of consciousness: awake and alert, oriented and patient cooperative Pain management: pain level controlled Vital Signs Assessment: post-procedure vital signs reviewed and stable Respiratory status: spontaneous breathing, nonlabored ventilation and respiratory function stable Cardiovascular status: blood pressure returned to baseline and stable Postop Assessment: no apparent nausea or vomiting Anesthetic complications: no   No notable events documented.  Last Vitals:  Vitals:   05/29/21 1332 05/29/21 1615  BP: 113/72 132/77  Pulse: 73 81  Resp: 18 18  Temp: 36.8 C 36.8 C  SpO2: 97% 100%    Last Pain:  Vitals:   05/29/21 1615  TempSrc:   PainSc: 0-No pain                 Lannie Fields

## 2021-05-29 NOTE — Anesthesia Preprocedure Evaluation (Addendum)
Anesthesia Evaluation  Patient identified by MRN, date of birth, ID band Patient awake    Reviewed: Allergy & Precautions, NPO status , Patient's Chart, lab work & pertinent test results  Airway Mallampati: II  TM Distance: >3 FB Neck ROM: Full    Dental no notable dental hx. (+) Teeth Intact, Dental Advisory Given   Pulmonary neg pulmonary ROS,    Pulmonary exam normal breath sounds clear to auscultation       Cardiovascular negative cardio ROS Normal cardiovascular exam Rhythm:Regular Rate:Normal     Neuro/Psych negative neurological ROS  negative psych ROS   GI/Hepatic negative GI ROS, Cholecystitis    Endo/Other  negative endocrine ROS  Renal/GU negative Renal ROS  negative genitourinary   Musculoskeletal negative musculoskeletal ROS (+)   Abdominal   Peds  Hematology negative hematology ROS (+) hct 34.9   Anesthesia Other Findings   Reproductive/Obstetrics negative OB ROS hcg neg                            Anesthesia Physical Anesthesia Plan  ASA: 1  Anesthesia Plan: General   Post-op Pain Management:    Induction: Intravenous  PONV Risk Score and Plan: 4 or greater and Ondansetron, Dexamethasone, Midazolam, Scopolamine patch - Pre-op, Treatment may vary due to age or medical condition and Aprepitant  Airway Management Planned: Oral ETT  Additional Equipment: None  Intra-op Plan:   Post-operative Plan: Extubation in OR  Informed Consent: I have reviewed the patients History and Physical, chart, labs and discussed the procedure including the risks, benefits and alternatives for the proposed anesthesia with the patient or authorized representative who has indicated his/her understanding and acceptance.     Dental advisory given  Plan Discussed with: CRNA  Anesthesia Plan Comments:        Anesthesia Quick Evaluation

## 2021-05-29 NOTE — Op Note (Signed)
Heidi Roman [id number] [birth year] 05/29/2021  Laparoscopic Cholecystectomy with ICG dye immunofluorescent procedure Note  Indications: This patient presents with symptomatic gallbladder disease and will undergo laparoscopic cholecystectomy.  sHe was admitted with acute calculus cholecystitis.  Pre-operative Diagnosis: Calculus of gallbladder with acute cholecystitis, without mention of obstruction  Post-operative Diagnosis: Same  Surgeon: Gaynelle Adu MD FACS  Assistants: Barnetta Chapel PA-C  Anesthesia: General endotracheal anesthesia  Procedure Details  The patient was seen again in the Holding Room. The risks, benefits, complications, treatment options, and expected outcomes were discussed with the patient. The possibilities of reaction to medication, pulmonary aspiration, perforation of viscus, bleeding, recurrent infection, finding a normal gallbladder, the need for additional procedures, failure to diagnose a condition, the possible need to convert to an open procedure, and creating a complication requiring transfusion or operation were discussed with the patient. The likelihood of improving the patient's symptoms with return to their baseline status is good.  The patient and/or family concurred with the proposed plan, giving informed consent. The site of surgery properly noted. The patient was taken to Operating Room, identified as Heidi Roman and the procedure verified as Laparoscopic Cholecystectomy. A Time Out was held and the above information confirmed. Antibiotic prophylaxis was administered.   Prior to the induction of general anesthesia, antibiotic prophylaxis was administered. General endotracheal anesthesia was then administered and tolerated well. After the induction, the abdomen was prepped with Chloraprep and draped in the sterile fashion. The patient was positioned in the supine position.  Local anesthetic agent was injected into the skin near the umbilicus and an  incision made. We dissected down to the abdominal fascia with blunt dissection.  The fascia was incised vertically and we entered the peritoneal cavity bluntly.  A pursestring suture of 0-Vicryl was placed around the fascial opening.  The Hasson cannula was inserted and secured with the stay suture.  Pneumoperitoneum was then created with CO2 and tolerated well without any adverse changes in the patient's vital signs. An 5-mm port was placed in the subxiphoid position.  Two 5-mm ports were placed in the right upper quadrant. All skin incisions were infiltrated with a local anesthetic agent before making the incision and placing the trocars.   We positioned the patient in reverse Trendelenburg, tilted slightly to the patient's left.  The gallbladder was identified.  The gallbladder was distended.  There was not really a lot of edema in the gallbladder wall.  In order to facilitate retraction I had to aspirate the gallbladder.  The fundus grasped and retracted cephalad. Adhesions were lysed bluntly and with the electrocautery where indicated, taking care not to injure any adjacent organs or viscus.  The duodenum was somewhat tethered to the body and infundibulum of the gallbladder.  It was not woody inflammation per se but it definitely took a little bit of time to carefully mobilize the duodenum off of the infundibulum.  This was done with [place] dissection, hook electrocautery, as well as blunt dissection with the suction irrigator catheter.  This was easily confirmed to be duodenum adhered to the infundibulum because immunofluorescent imaging opacified the bowel.  The infundibulum was grasped and retracted laterally, exposing the peritoneum overlying the triangle of Calot. This was then divided and exposed in a blunt fashion. A critical view of the cystic duct and cystic artery was obtained.  ICG immunofluorescent viewing was performed several times during the procedure for secondary confirmation of the  location of the cystic duct.  We were able to visualize the cystic  duct emptying the gallbladder as well as the confluence of the cystic duct with the common hepatic duct and the common bile duct.  The cystic duct was clearly identified and bluntly dissected circumferentially.   The cystic duct was then ligated with clips and divided. The cystic artery which had been identified & dissected free was ligated with clips and divided as well.   The gallbladder was dissected from the liver bed in retrograde fashion with the electrocautery. The gallbladder was removed and placed in an Ecco sac.  The gallbladder and Ecco sac were then removed through the umbilical port site after enlarging the fascial defect at the umbilicus with a pair of curved Metzenbaums. The liver bed was irrigated and inspected. Hemostasis was achieved with the electrocautery. Copious irrigation was utilized and was repeatedly aspirated until clear.  The pursestring suture was used to close the umbilical fascia.  2 additional interrupted 0 Vicryl's were placed at the umbilical fascia using a PMI suture passer.  We again inspected the right upper quadrant for hemostasis.  The umbilical closure was inspected and there was no air leak and nothing trapped within the closure. Pneumoperitoneum was released as we removed the trocars.  4-0 Monocryl was used to close the skin.   steri-strips, and clean dressings were applied. The patient was then extubated and brought to the recovery room in stable condition. Instrument, sponge, and needle counts were correct at closure and at the conclusion of the case.   Findings: Acute Cholecystitis with Cholelithiasis +critical view; confirmed with ICG dye   Estimated Blood Loss: Minimal         Drains: none         Specimens: Gallbladder           Complications: None; patient tolerated the procedure well.         Disposition: PACU - hemodynamically stable.         Condition: stable  Mary Sella.  Andrey Campanile, MD, FACS General, Bariatric, & Minimally Invasive Surgery [facility] [place]

## 2021-05-30 ENCOUNTER — Encounter (HOSPITAL_COMMUNITY): Payer: Self-pay | Admitting: General Surgery

## 2021-05-30 MED ORDER — ACETAMINOPHEN 325 MG PO TABS: 1000.0000 mg | ORAL_TABLET | Freq: Three times a day (TID) | ORAL | 0 refills | Status: AC

## 2021-05-30 MED ORDER — OXYCODONE HCL 5 MG PO TABS: 5.0000 mg | ORAL_TABLET | Freq: Four times a day (QID) | ORAL | 0 refills | Status: DC | PRN

## 2021-05-30 NOTE — Discharge Summary (Signed)
Physician Discharge Summary  Heidi Roman LSL:[id number] DOB: [birth year] DOA: 05/28/2021  PCP: System, Provider Not In  Admit date: 05/28/2021 Discharge date: 05/30/2021  Recommendations for Outpatient Follow-up:     Follow-up Information     [facility] Follow up in 3 week(s).   Specialty: General Surgery Contact information: [address] [place] [address] [phone]                Discharge Diagnoses:  Acute Calculus cholecystitis  Surgical Procedure: Laparoscopic cholecystectomy May 29, 2021 Dr. Andrey Campanile  Discharge Condition: Good Disposition: Home  Diet recommendation: Regular  Filed Weights   05/28/21 0858  Weight: 77.1 kg    History of present illness:  20 year old female without significant past medical history who presented to the [facility] ED due to epigastric and right upper quadrant pain. She state symptoms began one week ago as reflux like discomfort in setting of known history of reflux. She describes her symptoms as burning pain in the epigastrium that has now progressed to pain in right upper abdomen. She tried antiacid medications at home without relief. She has had nausea but no emesis. She denies fever, chills, urinary symptoms, chest pain, shortness of breath, diarrhea, constipation.   Work up in ED significant for normal LFTs and WBC and US showing: Cholelithiasis with mild diffuse gallbladder wall thickening. Although no sonographic Eulah Pont sign was evident, findings remain suspicious for cholecystitis.   General surgery was asked to see.  Hospital Course:  Patient was admitted for cholecystitis.  She was started on IV antibiotics.  She was taken to the operating room the next day for laparoscopic cholecystectomy.  Please see operative note for additional details.  On postoperative day 1 she was doing well.  She was tolerating a diet.  Her vital signs were stable.  Her pain was controlled.  She was ambulating the halls.  I  felt she was stable for discharge.  I discussed discharge instructions with her and her mom.  All of their questions were asked and answered  BP 103/62 (BP Location: Right Arm)   Pulse 73   Temp 98.2 F (36.8 C) (Oral)   Resp 18   Ht 5' 4.5" (1.638 m)   Wt 77.1 kg   SpO2 98%   BMI 28.73 kg/m   Gen: alert, NAD, non-toxic appearing Pupils: equal, no scleral icterus Pulm: Lungs clear to auscultation, symmetric chest rise CV: regular rate and rhythm Abd: soft, min tender, nondistended. No cellulitis. No incisional hernia Ext: no edema, no calf tenderness Skin: no rash, no jaundice    Discharge Instructions  Discharge Instructions     Call MD for:   Complete by: As directed    Temperature >101   Call MD for:  hives   Complete by: As directed    Call MD for:  persistant dizziness or light-headedness   Complete by: As directed    Call MD for:  persistant nausea and vomiting   Complete by: As directed    Call MD for:  redness, tenderness, or signs of infection (pain, swelling, redness, odor or green/yellow discharge around incision site)   Complete by: As directed    Call MD for:  severe uncontrolled pain   Complete by: As directed    Diet general   Complete by: As directed    Discharge instructions   Complete by: As directed    See [facility] discharge instructions   Increase activity slowly   Complete by: As  directed       Allergies as of 05/30/2021   No Known Allergies      Medication List     STOP taking these medications    benzonatate 100 MG capsule Commonly known as: TESSALON   meloxicam 7.5 MG tablet Commonly known as: MOBIC       TAKE these medications    acetaminophen 325 MG tablet Commonly known as: Tylenol Take 3 tablets (975 mg total) by mouth every 8 (eight) hours for 5 days. What changed:  how much to take when to take this reasons to take this   ibuprofen 600 MG tablet Commonly known as: ADVIL Take 1 tablet (600 mg total) by mouth every  6 (six) hours as needed for fever, mild pain or moderate pain.   norgestimate-ethinyl estradiol 0.25-35 MG-MCG tablet Commonly known as: ORTHO-CYCLEN Take 1 tablet by mouth daily.   oxyCODONE 5 MG immediate release tablet Commonly known as: Oxy IR/ROXICODONE Take 1 tablet (5 mg total) by mouth every 6 (six) hours as needed for severe pain.        Follow-up Information     [facility], [facility] Follow up in 3 week(s).   Specialty: General Surgery Contact information: [address] [place] [address] [phone]                  The results of significant diagnostics from this hospitalization (including imaging, microbiology, ancillary and laboratory) are listed below for reference.    Significant Diagnostic Studies: US Abdomen Limited RUQ (LIVER/GB)  Result Date: 05/28/2021 CLINICAL DATA:  Right upper quadrant pain EXAM: ULTRASOUND ABDOMEN LIMITED RIGHT UPPER QUADRANT COMPARISON:  None. FINDINGS: Gallbladder: Multiple echogenic, shadowing stones within the gallbladder lumen measuring up to 1.3 cm in diameter. Layering sludge is also present within the gallbladder. Mild diffuse gallbladder wall thickening. No pericholecystic fluid. No sonographic Murphy sign noted by sonographer. Common bile duct: Diameter: 6 mm. Liver: No focal lesion identified. Within normal limits in parenchymal echogenicity. Portal vein is patent on color Doppler imaging with normal direction of blood flow towards the liver. Other: None. IMPRESSION: Cholelithiasis with mild diffuse gallbladder wall thickening. Although no sonographic Eulah Pont sign was evident, findings remain suspicious for cholecystitis. If further imaging evaluation is clinically warranted, a nuclear medicine hepatobiliary scan could be performed to assess the patency of the cystic duct. Electronically Signed   By: Duanne Guess D.O.   On: 05/28/2021 10:51    Microbiology: Recent Results (from the past 240 hour(s))   Resp Panel by RT-PCR (Flu A&B, Covid) Nasopharyngeal Swab     Status: None   Collection Time: 05/28/21 11:16 AM   Specimen: Nasopharyngeal Swab; Nasopharyngeal(NP) swabs in vial transport medium  Result Value Ref Range Status   SARS Coronavirus 2 by RT PCR NEGATIVE NEGATIVE Final    Comment: (NOTE) SARS-CoV-2 target nucleic acids are NOT DETECTED.  The SARS-CoV-2 RNA is generally detectable in upper respiratory specimens during the acute phase of infection. The lowest concentration of SARS-CoV-2 viral copies this assay can detect is 138 copies/mL. A negative result does not preclude SARS-Cov-2 infection and should not be used as the sole basis for treatment or other patient management decisions. A negative result may occur with  improper specimen collection/handling, submission of specimen other than nasopharyngeal swab, presence of viral mutation(s) within the areas targeted by this assay, and inadequate number of viral copies(<138 copies/mL). A negative result must be combined with clinical observations, patient history, and epidemiological information. The  expected result is Negative.  Fact Sheet for Patients:  BloggerCourse.com  Fact Sheet for Healthcare Providers:  SeriousBroker.it  This test is no t yet approved or cleared by the [place] FDA and  has been authorized for detection and/or diagnosis of SARS-CoV-2 by FDA under an Emergency Use Authorization (EUA). This EUA will remain  in effect (meaning this test can be used) for the duration of the COVID-19 declaration under Section 564(b)(1) of the Act, 21 U.S.C.section 360bbb-3(b)(1), unless the authorization is terminated  or revoked sooner.       Influenza A by PCR NEGATIVE NEGATIVE Final   Influenza B by PCR NEGATIVE NEGATIVE Final    Comment: (NOTE) The Xpert Xpress SARS-CoV-2/FLU/RSV plus assay is intended as an aid in the diagnosis of influenza from  Nasopharyngeal swab specimens and should not be used as a sole basis for treatment. Nasal washings and aspirates are unacceptable for Xpert Xpress SARS-CoV-2/FLU/RSV testing.  Fact Sheet for Patients: BloggerCourse.com  Fact Sheet for Healthcare Providers: SeriousBroker.it  This test is not yet approved or cleared by the [place] FDA and has been authorized for detection and/or diagnosis of SARS-CoV-2 by FDA under an Emergency Use Authorization (EUA). This EUA will remain in effect (meaning this test can be used) for the duration of the COVID-19 declaration under Section 564(b)(1) of the Act, 21 U.S.C. section 360bbb-3(b)(1), unless the authorization is terminated or revoked.  Performed at [facility], [address], [place], [place] [address]      Labs: Basic Metabolic Panel: Recent Labs  Lab 05/28/21 0903 05/29/21 0423  NA 134* 137  K 4.3 4.4  CL 99 109  CO2 26 25  GLUCOSE 87 95  BUN 7 <5*  CREATININE 0.81 0.68  CALCIUM 9.3 8.8*   Liver Function Tests: Recent Labs  Lab 05/28/21 0903 05/29/21 0423  AST 19 16  ALT 17 17  ALKPHOS 61 50  BILITOT 0.5 0.5  PROT 8.3* 6.8  ALBUMIN 4.3 3.4*   Recent Labs  Lab 05/28/21 0903  LIPASE 29   No results for input(s): AMMONIA in the last 168 hours. CBC: Recent Labs  Lab 05/28/21 0903 05/29/21 0423  WBC 4.3 3.5*  HGB 12.5 11.4*  HCT 37.7 34.9*  MCV 87.7 89.0  PLT 335 292   Cardiac Enzymes: No results for input(s): CKTOTAL, CKMB, CKMBINDEX, TROPONINI in the last 168 hours. BNP: BNP (last 3 results) No results for input(s): BNP in the last 8760 hours.  ProBNP (last 3 results) No results for input(s): PROBNP in the last 8760 hours.  CBG: No results for input(s): GLUCAP in the last 168 hours.  Active Problems:   Acute cholecystitis   Time coordinating discharge: 20 min  Signed:  Atilano Ina, MD [facility]  Surgery, [place] [phone] 05/30/2021, 11:07 AM

## 2021-05-30 NOTE — Plan of Care (Signed)
Reviewed written d/c instructions w pt and her mom. All questions answered and they both verbalized understanding. D/C per w/c w all belongings in stable condition.

## 2021-06-02 LAB — SURGICAL PATHOLOGY

## 2021-11-24 DIAGNOSIS — Z113 Encounter for screening for infections with a predominantly sexual mode of transmission: Secondary | ICD-10-CM | POA: Diagnosis not present

## 2021-12-25 DIAGNOSIS — L42 Pityriasis rosea: Secondary | ICD-10-CM | POA: Diagnosis not present

## 2022-01-05 DIAGNOSIS — L42 Pityriasis rosea: Secondary | ICD-10-CM | POA: Diagnosis not present

## 2022-03-04 DIAGNOSIS — N390 Urinary tract infection, site not specified: Secondary | ICD-10-CM | POA: Diagnosis not present

## 2022-03-08 DIAGNOSIS — Z020 Encounter for examination for admission to educational institution: Secondary | ICD-10-CM | POA: Diagnosis not present

## 2022-03-08 DIAGNOSIS — Z23 Encounter for immunization: Secondary | ICD-10-CM | POA: Diagnosis not present

## 2022-03-08 DIAGNOSIS — Z111 Encounter for screening for respiratory tuberculosis: Secondary | ICD-10-CM | POA: Diagnosis not present

## 2022-03-17 DIAGNOSIS — J019 Acute sinusitis, unspecified: Secondary | ICD-10-CM | POA: Diagnosis not present

## 2022-03-17 DIAGNOSIS — J029 Acute pharyngitis, unspecified: Secondary | ICD-10-CM | POA: Diagnosis not present

## 2022-04-29 DIAGNOSIS — L309 Dermatitis, unspecified: Secondary | ICD-10-CM | POA: Diagnosis not present

## 2022-04-29 DIAGNOSIS — L42 Pityriasis rosea: Secondary | ICD-10-CM | POA: Diagnosis not present

## 2023-01-19 IMAGING — US US ABDOMEN LIMITED
1 series · 14 of 25 positions shown · non-contrast
Comparison: None.

CLINICAL DATA: Right upper quadrant pain

EXAM:
ULTRASOUND ABDOMEN LIMITED RIGHT UPPER QUADRANT

[Series 1: us abdomen limited · 14 of 33 slices shown]
[im 1/33]
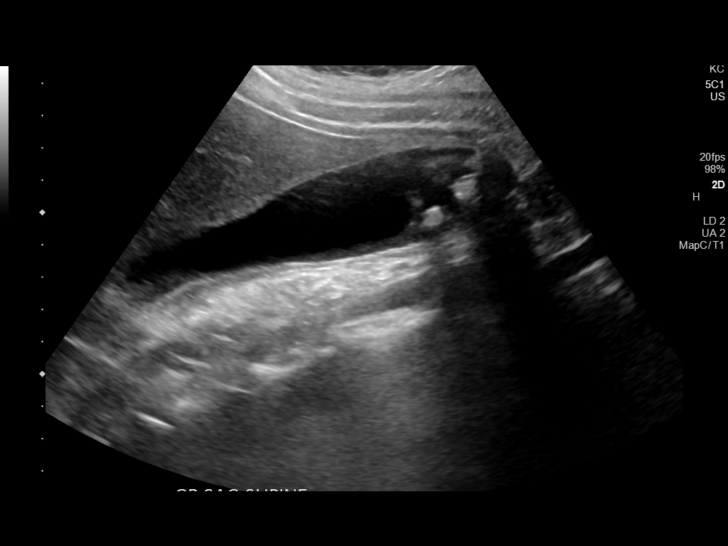
[im 3/33]
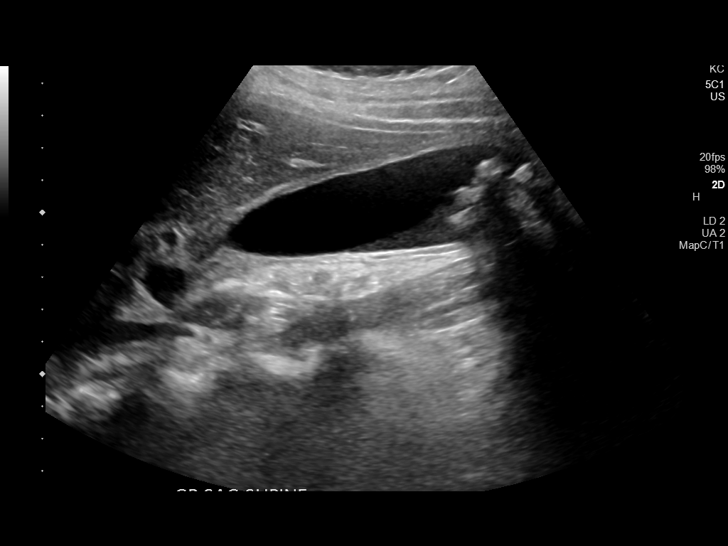
[im 6/33]
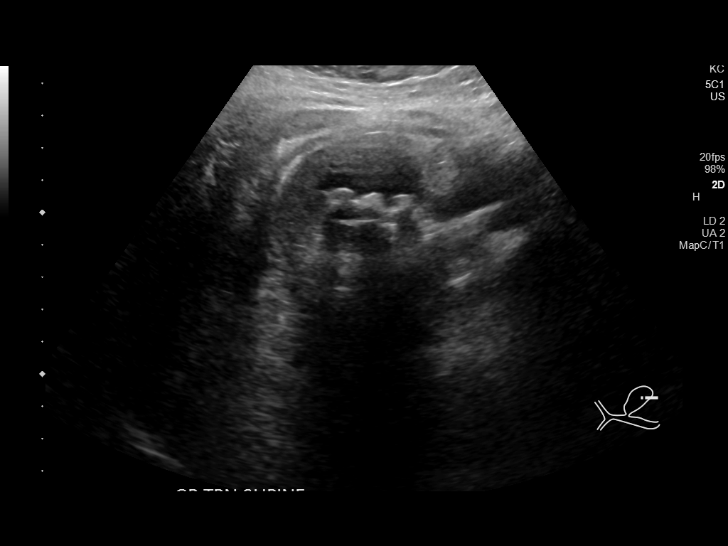
[im 9/33]
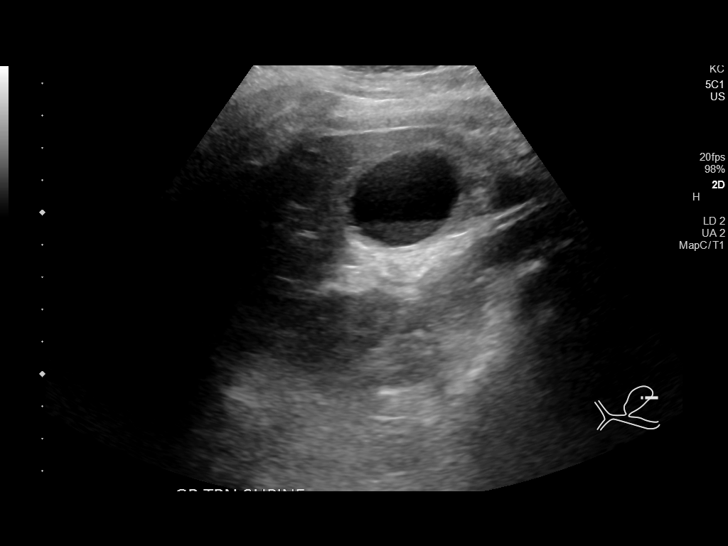
[im 11/33]
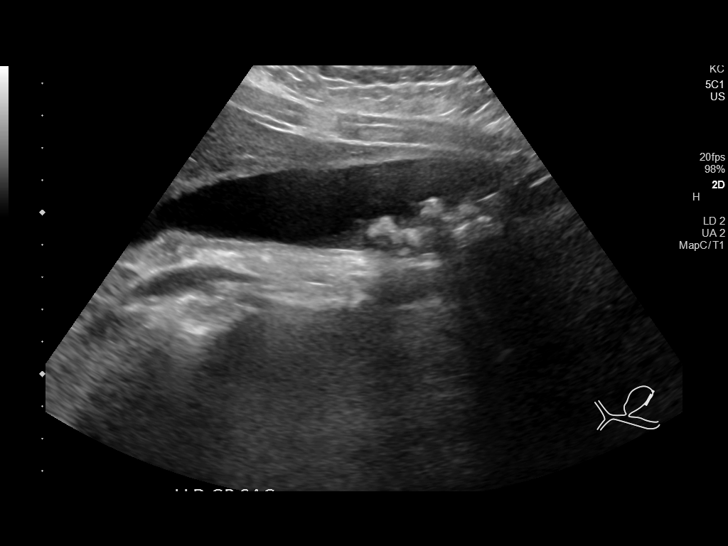
[im 13/33]
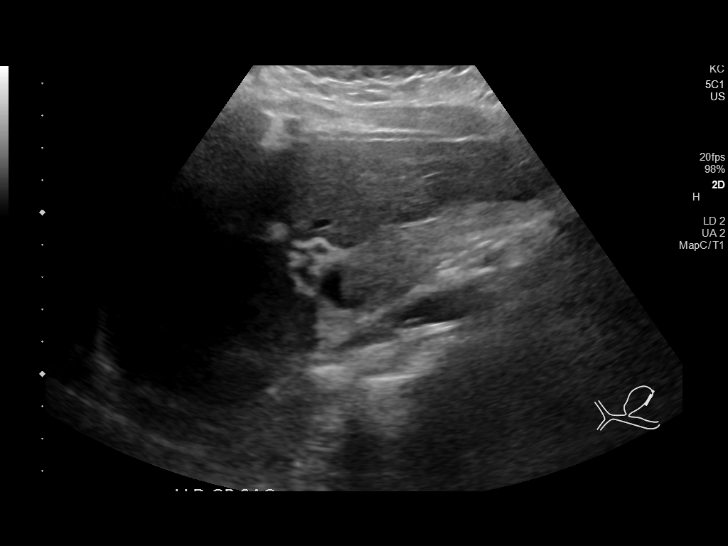
[im 15/33]
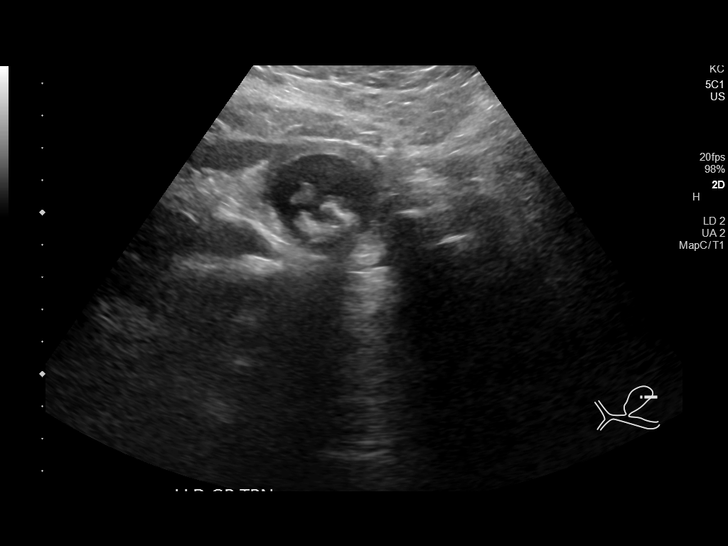
[im 18/33]
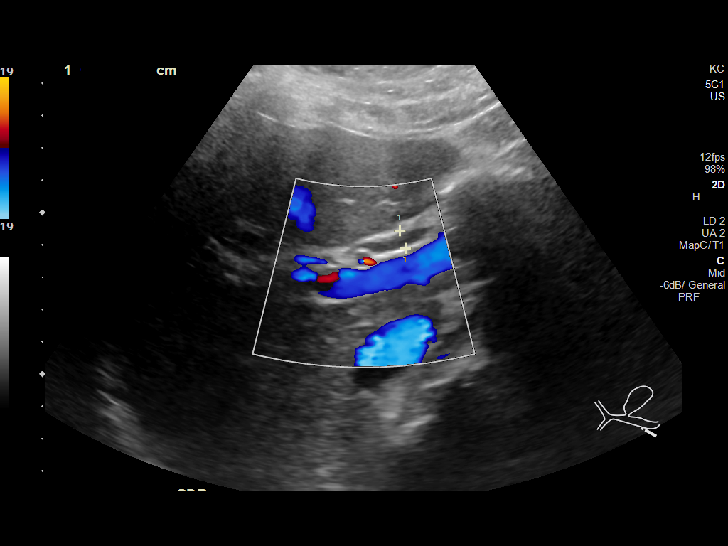
[im 21/33]
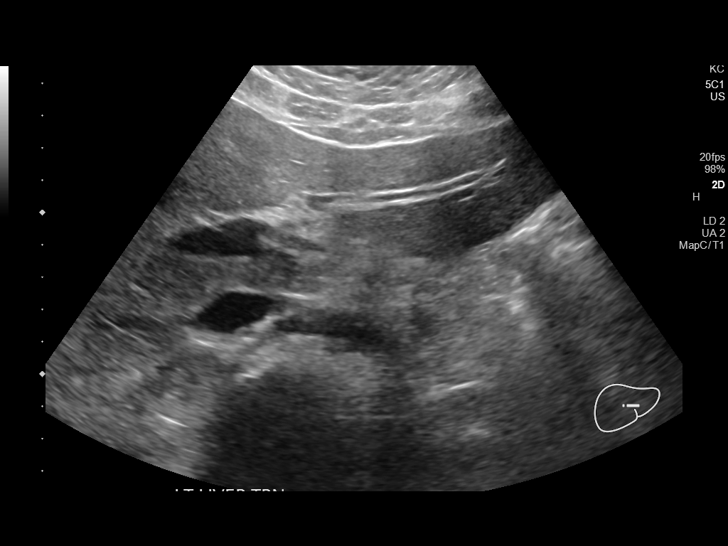
[im 22/33]
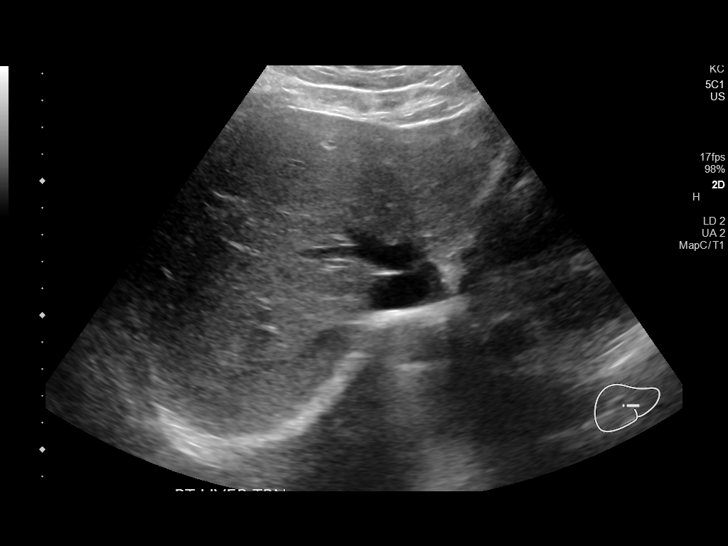
[im 25/33]
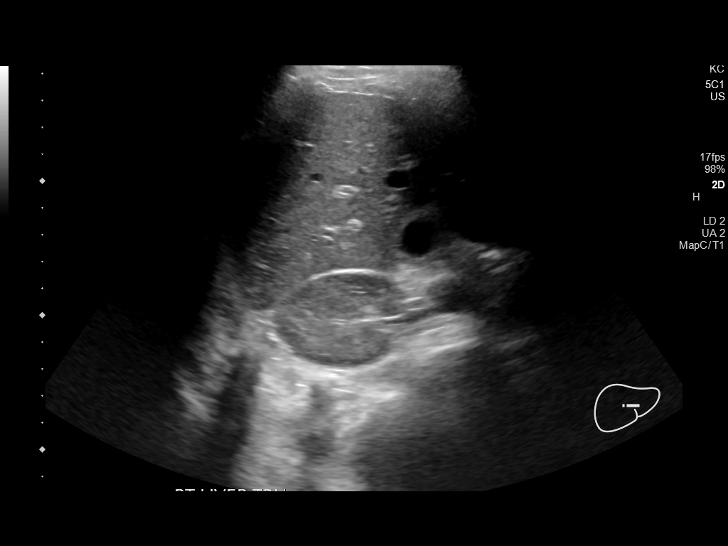
[im 27/33]
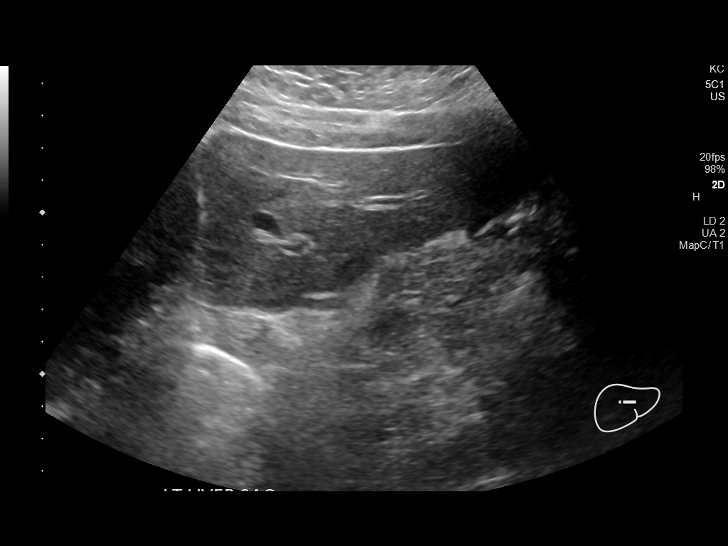
[im 30/33]
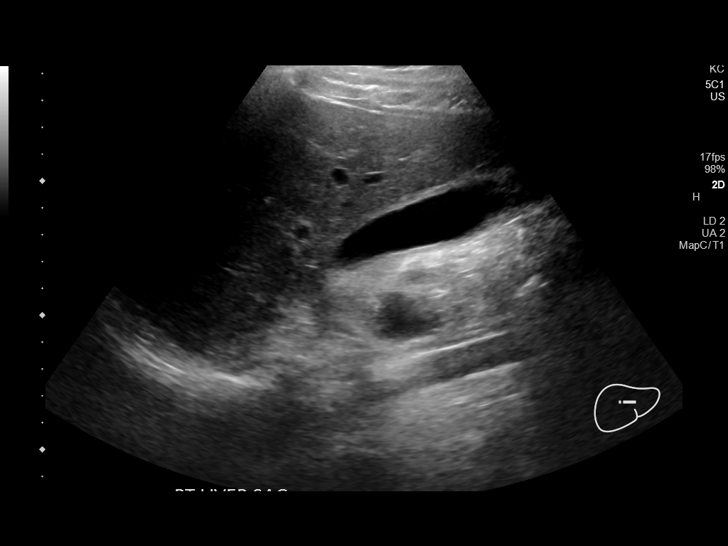
[im 33/33]
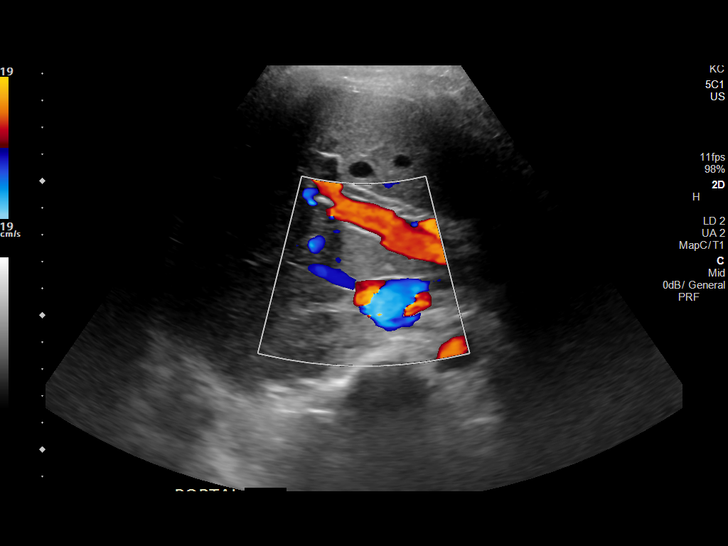

[14 of 25 positions shown; findings below may reference images not displayed]

FINDINGS: Gallbladder:

Multiple echogenic, shadowing stones within the gallbladder lumen
measuring up to 1.3 cm in diameter. Layering sludge is also present
within the gallbladder. Mild diffuse gallbladder wall thickening. No
pericholecystic fluid. No sonographic Murphy sign noted by
sonographer.

Common bile duct:

Diameter: 6 mm.

Liver:

No focal lesion identified. Within normal limits in parenchymal
echogenicity. Portal vein is patent on color Doppler imaging with
normal direction of blood flow towards the liver.

Other: None.
IMPRESSION: Cholelithiasis with mild diffuse gallbladder wall thickening.
Although no sonographic Murphy sign was evident, findings remain
suspicious for cholecystitis. If further imaging evaluation is
clinically warranted, a nuclear medicine hepatobiliary scan could be
performed to assess the patency of the cystic duct.

## 2023-04-06 ENCOUNTER — Other Ambulatory Visit (HOSPITAL_COMMUNITY): Payer: Self-pay

## 2023-04-06 MED ORDER — CEPHALEXIN 500 MG PO CAPS
ORAL_CAPSULE | ORAL | 0 refills | Status: DC
  Filled 2023-04-06: qty 10, 5d supply, fill #0

## 2023-04-07 ENCOUNTER — Other Ambulatory Visit (HOSPITAL_COMMUNITY): Payer: Self-pay

## 2023-06-04 ENCOUNTER — Other Ambulatory Visit: Payer: Self-pay

## 2023-06-04 ENCOUNTER — Emergency Department (HOSPITAL_COMMUNITY)
Admission: EM | Admit: 2023-06-04 | Discharge: 2023-06-04 | Disposition: A | Discharge status: Home / Self Care | Payer: BC Managed Care – PPO | Attending: Emergency Medicine | Admitting: Emergency Medicine

## 2023-06-04 DIAGNOSIS — N3090 Cystitis, unspecified without hematuria: Secondary | ICD-10-CM | POA: Diagnosis not present

## 2023-06-04 DIAGNOSIS — R300 Dysuria: Secondary | ICD-10-CM | POA: Diagnosis present

## 2023-06-04 LAB — BASIC METABOLIC PANEL
Anion gap: 8 (ref 5–15)
BUN: 17 mg/dL (ref 6–20)
CO2: 24 mmol/L (ref 22–32)
Calcium: 9.4 mg/dL (ref 8.9–10.3)
Chloride: 103 mmol/L (ref 98–111)
Creatinine, Ser: 0.75 mg/dL (ref 0.44–1.00)
GFR, Estimated: >60 mL/min (ref >60)
Glucose, Bld: 107 mg/dL — ABNORMAL HIGH (ref 70–99)
Potassium: 3.5 mmol/L (ref 3.5–5.1)
Sodium: 135 mmol/L (ref 135–145)

## 2023-06-04 LAB — URINALYSIS, W/ REFLEX TO CULTURE (INFECTION SUSPECTED)
Bilirubin Urine: NEGATIVE
Glucose, UA: NEGATIVE mg/dL
Ketones, ur: NEGATIVE mg/dL
Nitrite: NEGATIVE
Protein, ur: 100 mg/dL — AB
RBC / HPF: >50 RBC/hpf (ref 0–5)
Specific Gravity, Urine: 1.02 (ref 1.005–1.030)
WBC, UA: >50 WBC/hpf (ref 0–5)
pH: 5 (ref 5.0–8.0)

## 2023-06-04 LAB — CBC
HCT: 37.1 % (ref 36.0–46.0)
Hemoglobin: 11.9 g/dL — ABNORMAL LOW (ref 12.0–15.0)
MCH: 27.3 pg (ref 26.0–34.0)
MCHC: 32.1 g/dL (ref 30.0–36.0)
MCV: 85.1 fL (ref 80.0–100.0)
Platelets: 295 10*3/uL (ref 150–400)
RBC: 4.36 MIL/uL (ref 3.87–5.11)
RDW: 13.7 % (ref 11.5–15.5)
WBC: 4.6 10*3/uL (ref 4.0–10.5)
nRBC: 0 % (ref 0.0–0.2)

## 2023-06-04 LAB — PREGNANCY, URINE: Preg Test, Ur: NEGATIVE

## 2023-06-04 MED ORDER — NAPROXEN 500 MG PO TABS
500.0000 mg | ORAL_TABLET | Freq: Once | ORAL | Status: AC
  Administered 2023-06-04: 500 mg via ORAL
  Filled 2023-06-04: qty 1

## 2023-06-04 MED ORDER — PHENAZOPYRIDINE HCL 200 MG PO TABS: 200.0000 mg | ORAL_TABLET | Freq: Three times a day (TID) | ORAL | Status: DC

## 2023-06-04 MED ORDER — NITROFURANTOIN MONOHYD MACRO 100 MG PO CAPS: 100.0000 mg | ORAL_CAPSULE | Freq: Two times a day (BID) | ORAL | 0 refills | Status: AC

## 2023-06-04 MED ORDER — NITROFURANTOIN MONOHYD MACRO 100 MG PO CAPS
100.0000 mg | ORAL_CAPSULE | Freq: Once | ORAL | Status: AC
  Administered 2023-06-04: 100 mg via ORAL
  Filled 2023-06-04: qty 1

## 2023-06-04 MED ORDER — PHENAZOPYRIDINE HCL 200 MG PO TABS
200.0000 mg | ORAL_TABLET | Freq: Three times a day (TID) | ORAL | Status: DC
  Administered 2023-06-04: 200 mg via ORAL
  Filled 2023-06-04: qty 1

## 2023-06-04 MED ORDER — PHENAZOPYRIDINE HCL 200 MG PO TABS: 200.0000 mg | ORAL_TABLET | Freq: Three times a day (TID) | ORAL | 0 refills | Status: AC | PRN

## 2023-06-04 NOTE — ED Notes (Signed)
Pt verbalized understanding of discharge instructions. Opportunity for questions provided.  

## 2023-06-04 NOTE — ED Provider Notes (Signed)
[place] EMERGENCY DEPARTMENT AT [facility] Provider Note   CSN: [id number] Arrival date & time: 06/04/23  1449     History  Chief Complaint  Patient presents with   Dysuria    Heidi Roman is a 22 y.o. female no significant past medical history who presents to the ED today for dysuria and lower abdominal pain. Patient reports that she was seen 2 days ago in urgent care for the same symptoms and was started Cefdinir. Patient is taking the medication as prescribed. She denies any resolution of symptoms.  She reports dysuria, suprapubic pain, as well as right lower quadrant pain.  No fevers, chills, nausea, or back pain.  She is concerned that she could have a kidney infection.  No other complaints or concerns at this time.    Home Medications Prior to Admission medications   Medication Sig Start Date End Date Taking? Authorizing Provider  nitrofurantoin, macrocrystal-monohydrate, (MACROBID) 100 MG capsule Take 1 capsule (100 mg total) by mouth 2 (two) times daily for 7 days. 06/04/23 06/11/23 Yes Maxwell Marion, PA-C  phenazopyridine (PYRIDIUM) 200 MG tablet Take 1 tablet (200 mg total) by mouth 3 (three) times daily as needed for up to 7 days for pain. 06/04/23 06/11/23 Yes Maxwell Marion, PA-C  cephALEXin (KEFLEX) 500 MG capsule Take 1 capsule by mouth every 12 hours for 5 days 04/06/23     ibuprofen (ADVIL,MOTRIN) 600 MG tablet Take 1 tablet (600 mg total) by mouth every 6 (six) hours as needed for fever, mild pain or moderate pain. 11/21/17   Sherrilee Gilles, NP  norgestimate-ethinyl estradiol (ORTHO-CYCLEN) 0.25-35 MG-MCG tablet Take 1 tablet by mouth daily.    [provider]  oxyCODONE (OXY IR/ROXICODONE) 5 MG immediate release tablet Take 1 tablet (5 mg total) by mouth every 6 (six) hours as needed for severe pain. 05/30/21   Gaynelle Adu, MD      Allergies    Patient has no known allergies.    Review of Systems   Review of Systems  Genitourinary:   Positive for dysuria.  All other systems reviewed and are negative.   Physical Exam Updated Vital Signs BP (!) 125/91   Pulse 93   Temp 98 F (36.7 C) (Oral)   Resp 18   Ht 5\' 4"  (1.626 m)   Wt 77.1 kg   SpO2 100%   BMI 29.18 kg/m  Physical Exam Vitals and nursing note reviewed.  Constitutional:      Appearance: Normal appearance.  HENT:     Head: Normocephalic and atraumatic.     Mouth/Throat:     Mouth: Mucous membranes are moist.  Eyes:     Conjunctiva/sclera: Conjunctivae normal.     Pupils: Pupils are equal, round, and reactive to light.  Cardiovascular:     Rate and Rhythm: Normal rate and regular rhythm.     Pulses: Normal pulses.  Pulmonary:     Effort: Pulmonary effort is normal.     Breath sounds: Normal breath sounds.  Abdominal:     Palpations: Abdomen is soft.     Tenderness: There is abdominal tenderness.     Comments: Suprapubic and RLQ tenderness  Skin:    General: Skin is warm and dry.     Findings: No rash.  Neurological:     General: No focal deficit present.     Mental Status: She is alert.  Psychiatric:        Mood and Affect: Mood normal.  Behavior: Behavior normal.    ED Results / Procedures / Treatments   Labs (all labs ordered are listed, but only abnormal results are displayed) Labs Reviewed  URINALYSIS, W/ REFLEX TO CULTURE (INFECTION SUSPECTED) - Abnormal; Notable for the following components:      Result Value   APPearance CLOUDY (*)    Hgb urine dipstick LARGE (*)    Protein, ur 100 (*)    Leukocytes,Ua LARGE (*)    Bacteria, UA RARE (*)    All other components within normal limits  BASIC METABOLIC PANEL - Abnormal; Notable for the following components:   Glucose, Bld 107 (*)    All other components within normal limits  CBC - Abnormal; Notable for the following components:   Hemoglobin 11.9 (*)    All other components within normal limits  URINE CULTURE  PREGNANCY, URINE    EKG None  Radiology No results  found.  Procedures Procedures: not indicated.   Medications Ordered in ED Medications  phenazopyridine (PYRIDIUM) tablet 200 mg (200 mg Oral Given 06/04/23 1816)  naproxen (NAPROSYN) tablet 500 mg (500 mg Oral Given 06/04/23 1604)  nitrofurantoin (macrocrystal-monohydrate) (MACROBID) capsule 100 mg (100 mg Oral Given 06/04/23 1816)    ED Course/ Medical Decision Making/ A&P                                 Medical Decision Making Amount and/or Complexity of Data Reviewed Labs: ordered.  Risk Prescription drug management.   This patient presents to the ED for concern of lower abdominal pain and dysuria, this involves an extensive number of treatment options, and is a complaint that carries with it a high risk of complications and morbidity.   Differential diagnosis includes: UTI, pyelonephritis, kidney stone, appendicitis, etc.   Co morbidities that complicate the patient evaluation  See HPI above   Additional history  Additional history obtained from patient's recent urgent care note.   Lab Tests  I ordered and personally interpreted labs.  The pertinent results include:  Large leukocyte estrace, bacteria, protein, and hemoglobin on UA. Bradycardia test is negative BMP and CBC are within normal limits - no electrolyte abnormality, AKI, acute anemia, or infection.   Problem List / ED Course / Critical interventions / Medication management  Lower abdominal pain and dysuria I ordered medications including: Naproxen for pain  Reevaluation of the patient after these medicines showed that the patient improved And reviewed patient's previous urine culture sensitivity report, which indicated the medication that she was taking would not be effective to treat the bacteria present. I have reviewed the patients home medicines and have made adjustments as needed   Social Determinants of Health  Access to healthcare   Test / Admission - Considered  Discussed results  with patient.  Since she is afebrile and does not have a white count, low suspicion for pyelonephritis. All questions answered. She is hemodynamically stable and vitals are within normal limits. Safe for discharge home. Instructed patient to stop Cefdinir and started patient on Macrobid, which showed sensitivity to her urine culture from her urgent care visit. Prescription for Macrobid and Pyridium sent to pharmacy. I informed patient that we will send her urine that we collected today for culture.  She will receive a call if she is to switch medications. Return precautions provided.       Final Clinical Impression(s) / ED Diagnoses Final diagnoses:  Cystitis    Rx /  DC Orders ED Discharge Orders          Ordered    nitrofurantoin, macrocrystal-monohydrate, (MACROBID) 100 MG capsule  2 times daily        06/04/23 1723    phenazopyridine (PYRIDIUM) 200 MG tablet  3 times daily PRN        06/04/23 1723              Maxwell Marion, PA-C 06/04/23 2006    Terald Sleeper, MD 06/04/23 2015

## 2023-06-04 NOTE — ED Triage Notes (Signed)
Pt arrived via POV. Pt c/o dysuria for several days. Has been taking Cefdinir w/no improvement.  AOx4

## 2023-06-04 NOTE — ED Provider Triage Note (Signed)
Emergency Medicine Provider Triage Evaluation Note  Heidi Roman , a 22 y.o. female  was evaluated in triage.  Pt complains of lower abdominal pain and dysuria.  Review of Systems  Positive: Difficulty and pain with urination, right flank pain Negative: Fever, vomiting  Physical Exam  BP (!) 153/98 (BP Location: Right Arm)   Pulse 99   Temp 98 F (36.7 C) (Oral)   Resp 18   Ht 5\' 4"  (1.626 m)   Wt 77.1 kg   SpO2 100%   BMI 29.18 kg/m  Gen:   Awake, no distress   Resp:  Normal effort  MSK:   Moves extremities without difficulty  Other:  Suprapubic tenderness and right flank tenderness  Medical Decision Making  Medically screening exam initiated at 3:48 PM.  Appropriate orders placed.  Heidi Roman was informed that the remainder of the evaluation will be completed by another provider, this initial triage assessment does not replace that evaluation, and the importance of remaining in the ED until their evaluation is complete.   Maxwell Marion, PA-C 06/04/23 1549

## 2023-06-04 NOTE — Discharge Instructions (Addendum)
As discussed, your UA shows that you still have a urinary infection. I want you to stop the Cefdinir as it does not help treat the infection you have. I have started you on Macrobid. Take this medication twice a day for 7 days. Please complete full course of antibiotics to ensure resolution of symptoms. Additionally, I have sent in Pyridium to your pharmacy. You can take this up to 3 times a day as needed for bladder discomfort/pain with urination.  You will receive a call from [place] if the culture shows that you're infection is not susceptible to the medication we have started you on and a new medication will be sent to your pharmacy. If the medication you're on is still susceptible to the bacteria, you will not hear from [place].  Follow up with PCP in 3-5 days for reevaluation of your symptoms.  Get help right away if: You have very bad pain in your back or lower belly. You faint.

## 2023-06-06 LAB — URINE CULTURE: Culture: <10000 — AB

## 2023-10-01 ENCOUNTER — Telehealth: Payer: BC Managed Care – PPO | Admitting: Nurse Practitioner

## 2023-10-01 ENCOUNTER — Telehealth: Payer: BC Managed Care – PPO

## 2023-10-01 DIAGNOSIS — R399 Unspecified symptoms and signs involving the genitourinary system: Secondary | ICD-10-CM | POA: Diagnosis not present

## 2023-10-01 MED ORDER — CEPHALEXIN 500 MG PO CAPS
500.0000 mg | ORAL_CAPSULE | Freq: Two times a day (BID) | ORAL | 0 refills | Status: AC
Start: 2023-10-01 — End: 2023-10-08

## 2023-10-01 NOTE — Patient Instructions (Signed)
  Heidi Roman, thank you for joining Claiborne Rigg, NP for today's virtual visit.  While this provider is not your primary care provider (PCP), if your PCP is located in our provider database this encounter information will be shared with them immediately following your visit.   A [facility] MyChart account gives you access to today's visit and all your visits, tests, and labs performed at [facility] " click here if you don't have a [facility] MyChart account or go to [email or web address]  Consent: (Patient) Heidi Roman provided verbal consent for this virtual visit at the beginning of the encounter.  Current Medications:  Current Outpatient Medications:    cephALEXin (KEFLEX) 500 MG capsule, Take 1 capsule (500 mg total) by mouth 2 (two) times daily for 7 days., Disp: 14 capsule, Rfl: 0   ibuprofen (ADVIL,MOTRIN) 600 MG tablet, Take 1 tablet (600 mg total) by mouth every 6 (six) hours as needed for fever, mild pain or moderate pain., Disp: 30 tablet, Rfl: 0   norgestimate-ethinyl estradiol (ORTHO-CYCLEN) 0.25-35 MG-MCG tablet, Take 1 tablet by mouth daily., Disp: , Rfl:    oxyCODONE (OXY IR/ROXICODONE) 5 MG immediate release tablet, Take 1 tablet (5 mg total) by mouth every 6 (six) hours as needed for severe pain., Disp: 15 tablet, Rfl: 0   Medications ordered in this encounter:  Meds ordered this encounter  Medications   cephALEXin (KEFLEX) 500 MG capsule    Sig: Take 1 capsule (500 mg total) by mouth 2 (two) times daily for 7 days.    Dispense:  14 capsule    Refill:  0    Order Specific Question:   Supervising Provider    Answer:   Merrilee Jansky [id number]     *If you need refills on other medications prior to your next appointment, please contact your pharmacy*  Follow-Up: Call back or seek an in-person evaluation if the symptoms worsen or if the condition fails to improve as anticipated.  [facility] [phone]  Other  Instructions Follow up with PCP for referral or Urogyn for re occurring UTIs   If you have been instructed to have an in-person evaluation today at a local Urgent Care facility, please use the link below. It will take you to a list of all of our available [facility], including address, phone number and hours of operation. Please do not delay care.  [facility]  If you or a family member do not have a primary care provider, use the link below to schedule a visit and establish care. When you choose a [facility] primary care physician or advanced practice provider, you gain a long-term partner in health. Find a Primary Care Provider  Learn more about [facility] in-office and virtual care options: [facility] - Get Care Now

## 2023-10-01 NOTE — Progress Notes (Signed)
Virtual Visit Consent   Heidi Roman, you are scheduled for a virtual visit with a [facility] provider today. Just as with appointments in the office, your consent must be obtained to participate. Your consent will be active for this visit and any virtual visit you may have with one of our providers in the next 365 days. If you have a MyChart account, a copy of this consent can be sent to you electronically.  As this is a virtual visit, video technology does not allow for your provider to perform a traditional examination. This may limit your provider's ability to fully assess your condition. If your provider identifies any concerns that need to be evaluated in person or the need to arrange testing (such as labs, EKG, etc.), we will make arrangements to do so. Although advances in technology are sophisticated, we cannot ensure that it will always work on either your end or our end. If the connection with a video visit is poor, the visit may have to be switched to a telephone visit. With either a video or telephone visit, we are not always able to ensure that we have a secure connection.  By engaging in this virtual visit, you consent to the provision of healthcare and authorize for your insurance to be billed (if applicable) for the services provided during this visit. Depending on your insurance coverage, you may receive a charge related to this service.  I need to obtain your verbal consent now. Are you willing to proceed with your visit today? Heidi Roman has provided verbal consent on 10/01/2023 for a virtual visit (video or telephone). Claiborne Rigg, NP  Date: 10/01/2023 6:59 PM  Virtual Visit via Video Note   I, Claiborne Rigg, connected with  Heidi Roman  ([id number], [birth year]) on 10/01/23 at  7:00 PM EST by a video-enabled telemedicine application and verified that I am speaking with the correct person using two identifiers.  Location: Patient: Virtual Visit Location Patient:  Other: WORK Provider: Virtual Visit Location Provider: Home Office   I discussed the limitations of evaluation and management by telemedicine and the availability of in person appointments. The patient expressed understanding and agreed to proceed.    History of Present Illness: Heidi Roman is a 22 y.o. who identifies as a female who was assigned female at birth, and is being seen today for re occurring UTI symptoms.   Heidi Roman endorses new onset of low back pain, cloudy urine, urgency of urination and dysuria. She has a history of UTIs and current symptoms are similar. Denies any symptoms of vaginitis.     Problems:  Patient Active Problem List   Diagnosis Date Noted   Acute cholecystitis 05/28/2021    Allergies: No Known Allergies Medications:  Current Outpatient Medications:    cephALEXin (KEFLEX) 500 MG capsule, Take 1 capsule (500 mg total) by mouth 2 (two) times daily for 7 days., Disp: 14 capsule, Rfl: 0   ibuprofen (ADVIL,MOTRIN) 600 MG tablet, Take 1 tablet (600 mg total) by mouth every 6 (six) hours as needed for fever, mild pain or moderate pain., Disp: 30 tablet, Rfl: 0   norgestimate-ethinyl estradiol (ORTHO-CYCLEN) 0.25-35 MG-MCG tablet, Take 1 tablet by mouth daily., Disp: , Rfl:    oxyCODONE (OXY IR/ROXICODONE) 5 MG immediate release tablet, Take 1 tablet (5 mg total) by mouth every 6 (six) hours as needed for severe pain., Disp: 15 tablet, Rfl: 0  Observations/Objective: Patient is well-developed, well-nourished in no acute distress. .  Head is  normocephalic, atraumatic.  No labored breathing.  Speech is clear and coherent with logical content.  Patient is alert and oriented at baseline.    Assessment and Plan: 1. UTI symptoms - cephALEXin (KEFLEX) 500 MG capsule; Take 1 capsule (500 mg total) by mouth 2 (two) times daily for 7 days.  Dispense: 14 capsule; Refill: 0 Follow up with PCP for referral or Urogyn for re occurring UTIs  Follow Up Instructions: I  discussed the assessment and treatment plan with the patient. The patient was provided an opportunity to ask questions and all were answered. The patient agreed with the plan and demonstrated an understanding of the instructions.  A copy of instructions were sent to the patient via MyChart unless otherwise noted below.   The patient was advised to call back or seek an in-person evaluation if the symptoms worsen or if the condition fails to improve as anticipated.    Claiborne Rigg, NP

## 2023-12-21 ENCOUNTER — Telehealth: Payer: Self-pay | Admitting: Family

## 2023-12-21 ENCOUNTER — Telehealth: Payer: Self-pay

## 2023-12-21 DIAGNOSIS — R399 Unspecified symptoms and signs involving the genitourinary system: Secondary | ICD-10-CM

## 2023-12-21 MED ORDER — CEPHALEXIN 500 MG PO CAPS
500.0000 mg | ORAL_CAPSULE | Freq: Two times a day (BID) | ORAL | 0 refills | Status: AC
Start: 2023-12-21 — End: ?

## 2023-12-21 NOTE — Progress Notes (Signed)
 Virtual Visit Consent   Heidi Roman, you are scheduled for a virtual visit with a [facility] provider today. Just as with appointments in the office, your consent must be obtained to participate. Your consent will be active for this visit and any virtual visit you may have with one of our providers in the next 365 days. If you have a MyChart account, a copy of this consent can be sent to you electronically.  As this is a virtual visit, video technology does not allow for your provider to perform a traditional examination. This may limit your provider's ability to fully assess your condition. If your provider identifies any concerns that need to be evaluated in person or the need to arrange testing (such as labs, EKG, etc.), we will make arrangements to do so. Although advances in technology are sophisticated, we cannot ensure that it will always work on either your end or our end. If the connection with a video visit is poor, the visit may have to be switched to a telephone visit. With either a video or telephone visit, we are not always able to ensure that we have a secure connection.  By engaging in this virtual visit, you consent to the provision of healthcare and authorize for your insurance to be billed (if applicable) for the services provided during this visit. Depending on your insurance coverage, you may receive a charge related to this service.  I need to obtain your verbal consent now. Are you willing to proceed with your visit today? Heidi Roman has provided verbal consent on 12/21/2023 for a virtual visit (video or telephone). Jannifer Rodney, FNP  Date: 12/21/2023 5:27 PM   Virtual Visit via Video Note   I, Jannifer Rodney, connected with  Heidi Roman  ([id number], 2000-11-27) on 12/21/23 at  5:30 PM EST by a video-enabled telemedicine application and verified that I am speaking with the correct person using two identifiers.  Location: Patient: Virtual Visit Location Patient:  Other: car Provider: Virtual Visit Location Provider: Home Office   I discussed the limitations of evaluation and management by telemedicine and the availability of in person appointments. The patient expressed understanding and agreed to proceed.    History of Present Illness: Heidi Roman is a 23 y.o. who identifies as a female who was assigned female at birth, and is being seen today for urinary frequency and urgency.  HPI: Urinary Frequency  This is a new problem. The current episode started in the past 7 days. The problem occurs intermittently. The problem has been gradually worsening. The patient is experiencing no pain. There has been no fever. Associated symptoms include frequency and urgency. Pertinent negatives include no discharge, flank pain, hematuria, hesitancy, nausea or vomiting. She has tried increased fluids for the symptoms. The treatment provided mild relief.    Problems:  Patient Active Problem List   Diagnosis Date Noted   Acute cholecystitis 05/28/2021    Allergies: No Known Allergies Medications:  Current Outpatient Medications:    cephALEXin (KEFLEX) 500 MG capsule, Take 1 capsule (500 mg total) by mouth 2 (two) times daily., Disp: 14 capsule, Rfl: 0   norgestimate-ethinyl estradiol (ORTHO-CYCLEN) 0.25-35 MG-MCG tablet, Take 1 tablet by mouth daily., Disp: , Rfl:   Observations/Objective: Patient is well-developed, well-nourished in no acute distress.  Resting comfortably  in car Head is normocephalic, atraumatic.  No labored breathing.  Speech is clear and coherent with logical content.  Patient is alert and oriented at baseline.    Assessment and Plan:  1. UTI symptoms (Primary) - cephALEXin (KEFLEX) 500 MG capsule; Take 1 capsule (500 mg total) by mouth 2 (two) times daily.  Dispense: 14 capsule; Refill: 0  Force fluids AZO over the counter X2 days Follow up if symptoms worsen or do not improve  Follow Up Instructions: I discussed the assessment  and treatment plan with the patient. The patient was provided an opportunity to ask questions and all were answered. The patient agreed with the plan and demonstrated an understanding of the instructions.  A copy of instructions were sent to the patient via MyChart unless otherwise noted below.     The patient was advised to call back or seek an in-person evaluation if the symptoms worsen or if the condition fails to improve as anticipated.    Jannifer Rodney, FNP

## 2024-03-17 DIAGNOSIS — J069 Acute upper respiratory infection, unspecified: Secondary | ICD-10-CM | POA: Diagnosis not present
